# Patient Record
Sex: Female | Born: 1945
Health system: Southern US, Community
[De-identification: ages and names within clinical notes are randomized; demographics above are authoritative.]

## PROBLEM LIST (undated history)

## (undated) DIAGNOSIS — I10 Essential (primary) hypertension: Secondary | ICD-10-CM

## (undated) DIAGNOSIS — K219 Gastro-esophageal reflux disease without esophagitis: Secondary | ICD-10-CM

## (undated) DIAGNOSIS — Z8719 Personal history of other diseases of the digestive system: Secondary | ICD-10-CM

## (undated) DIAGNOSIS — I639 Cerebral infarction, unspecified: Secondary | ICD-10-CM

## (undated) DIAGNOSIS — M1990 Unspecified osteoarthritis, unspecified site: Secondary | ICD-10-CM

## (undated) HISTORY — PX: BILATERAL CARPAL TUNNEL RELEASE: SHX6508

## (undated) HISTORY — PX: TONSILLECTOMY: SUR1361

## (undated) HISTORY — PX: CHOLECYSTECTOMY: SHX55

## (undated) HISTORY — PX: JOINT REPLACEMENT: SHX530

## (undated) HISTORY — PX: OTHER SURGICAL HISTORY: SHX169

---

## 1997-10-24 ENCOUNTER — Other Ambulatory Visit: Admission: RE | Admit: 1997-10-24 | Discharge: 1997-10-24 | Payer: Self-pay | Admitting: Gynecology

## 1997-10-25 ENCOUNTER — Ambulatory Visit (HOSPITAL_BASED_OUTPATIENT_CLINIC_OR_DEPARTMENT_OTHER): Admission: RE | Admit: 1997-10-25 | Discharge: 1997-10-25 | Payer: Self-pay | Admitting: Orthopedic Surgery

## 1997-12-11 ENCOUNTER — Ambulatory Visit (HOSPITAL_BASED_OUTPATIENT_CLINIC_OR_DEPARTMENT_OTHER): Admission: RE | Admit: 1997-12-11 | Discharge: 1997-12-11 | Payer: Self-pay | Admitting: Orthopedic Surgery

## 1998-05-14 ENCOUNTER — Other Ambulatory Visit: Admission: RE | Admit: 1998-05-14 | Discharge: 1998-05-14 | Payer: Self-pay | Admitting: Gynecology

## 1998-11-21 ENCOUNTER — Other Ambulatory Visit: Admission: RE | Admit: 1998-11-21 | Discharge: 1998-11-21 | Payer: Self-pay | Admitting: Gynecology

## 2000-01-20 ENCOUNTER — Other Ambulatory Visit: Admission: RE | Admit: 2000-01-20 | Discharge: 2000-01-20 | Payer: Self-pay | Admitting: Gynecology

## 2000-09-16 ENCOUNTER — Encounter: Payer: Self-pay | Admitting: Orthopedic Surgery

## 2000-09-20 ENCOUNTER — Inpatient Hospital Stay (HOSPITAL_COMMUNITY): Admission: RE | Admit: 2000-09-20 | Discharge: 2000-09-24 | Payer: Self-pay | Admitting: Orthopedic Surgery

## 2000-11-01 ENCOUNTER — Observation Stay (HOSPITAL_COMMUNITY): Admission: RE | Admit: 2000-11-01 | Discharge: 2000-11-02 | Payer: Self-pay | Admitting: Orthopedic Surgery

## 2001-04-26 ENCOUNTER — Other Ambulatory Visit: Admission: RE | Admit: 2001-04-26 | Discharge: 2001-04-26 | Payer: Self-pay | Admitting: Gynecology

## 2002-09-11 ENCOUNTER — Other Ambulatory Visit: Admission: RE | Admit: 2002-09-11 | Discharge: 2002-09-11 | Payer: Self-pay | Admitting: Gynecology

## 2002-09-21 ENCOUNTER — Ambulatory Visit (HOSPITAL_COMMUNITY): Admission: RE | Admit: 2002-09-21 | Discharge: 2002-09-21 | Payer: Self-pay | Admitting: Gynecology

## 2003-02-07 ENCOUNTER — Encounter: Payer: Self-pay | Admitting: Orthopedic Surgery

## 2003-02-19 ENCOUNTER — Inpatient Hospital Stay (HOSPITAL_COMMUNITY): Admission: RE | Admit: 2003-02-19 | Discharge: 2003-02-22 | Payer: Self-pay | Admitting: Orthopedic Surgery

## 2003-04-19 ENCOUNTER — Encounter: Payer: Self-pay | Admitting: Emergency Medicine

## 2003-04-20 ENCOUNTER — Encounter: Payer: Self-pay | Admitting: Internal Medicine

## 2003-04-20 ENCOUNTER — Inpatient Hospital Stay (HOSPITAL_COMMUNITY): Admission: EM | Admit: 2003-04-20 | Discharge: 2003-04-22 | Payer: Self-pay | Admitting: Emergency Medicine

## 2003-11-01 ENCOUNTER — Other Ambulatory Visit: Admission: RE | Admit: 2003-11-01 | Discharge: 2003-11-01 | Payer: Self-pay | Admitting: Gynecology

## 2004-12-11 ENCOUNTER — Other Ambulatory Visit: Admission: RE | Admit: 2004-12-11 | Discharge: 2004-12-11 | Payer: Self-pay | Admitting: Gynecology

## 2005-01-22 ENCOUNTER — Observation Stay (HOSPITAL_COMMUNITY): Admission: RE | Admit: 2005-01-22 | Discharge: 2005-01-23 | Payer: Self-pay | Admitting: Gynecology

## 2006-07-15 ENCOUNTER — Encounter: Admission: RE | Admit: 2006-07-15 | Discharge: 2006-07-15 | Payer: Self-pay | Admitting: Internal Medicine

## 2011-05-08 ENCOUNTER — Encounter (HOSPITAL_COMMUNITY): Payer: PRIVATE HEALTH INSURANCE

## 2011-05-08 ENCOUNTER — Other Ambulatory Visit: Payer: Self-pay | Admitting: Orthopedic Surgery

## 2011-05-08 ENCOUNTER — Ambulatory Visit (HOSPITAL_COMMUNITY)
Admission: RE | Admit: 2011-05-08 | Discharge: 2011-05-08 | Disposition: A | Discharge status: Home / Self Care | Payer: PRIVATE HEALTH INSURANCE | Source: Ambulatory Visit | Attending: Orthopedic Surgery | Admitting: Orthopedic Surgery

## 2011-05-08 ENCOUNTER — Other Ambulatory Visit (HOSPITAL_COMMUNITY): Payer: Self-pay | Admitting: Orthopedic Surgery

## 2011-05-08 DIAGNOSIS — Z01818 Encounter for other preprocedural examination: Secondary | ICD-10-CM

## 2011-05-08 DIAGNOSIS — M169 Osteoarthritis of hip, unspecified: Secondary | ICD-10-CM | POA: Insufficient documentation

## 2011-05-08 DIAGNOSIS — M1610 Unilateral primary osteoarthritis, unspecified hip: Secondary | ICD-10-CM | POA: Insufficient documentation

## 2011-05-08 DIAGNOSIS — Z01812 Encounter for preprocedural laboratory examination: Secondary | ICD-10-CM | POA: Insufficient documentation

## 2011-05-08 DIAGNOSIS — I10 Essential (primary) hypertension: Secondary | ICD-10-CM | POA: Insufficient documentation

## 2011-05-08 LAB — CBC
HCT: 42.1 % (ref 36.0–46.0)
Hemoglobin: 13.5 g/dL (ref 12.0–15.0)
MCH: 27.1 pg (ref 26.0–34.0)
MCHC: 32.1 g/dL (ref 30.0–36.0)
MCV: 84.5 fL (ref 78.0–100.0)
Platelets: 229 10*3/uL (ref 150–400)
RBC: 4.98 MIL/uL (ref 3.87–5.11)
RDW: 13.1 % (ref 11.5–15.5)
WBC: 7.6 10*3/uL (ref 4.0–10.5)

## 2011-05-08 LAB — COMPREHENSIVE METABOLIC PANEL
ALT: 17 U/L (ref 0–35)
AST: 26 U/L (ref 0–37)
Alkaline Phosphatase: 86 U/L (ref 39–117)
CO2: 29 mEq/L (ref 19–32)
Chloride: 102 mEq/L (ref 96–112)
GFR calc Af Amer: >90 mL/min (ref >90)
GFR calc non Af Amer: >90 mL/min (ref >90)
Glucose, Bld: 110 mg/dL — ABNORMAL HIGH (ref 70–99)
Potassium: 3.6 mEq/L (ref 3.5–5.1)
Sodium: 140 mEq/L (ref 135–145)

## 2011-05-08 LAB — URINALYSIS, ROUTINE W REFLEX MICROSCOPIC
Glucose, UA: NEGATIVE mg/dL
Hgb urine dipstick: NEGATIVE
Protein, ur: NEGATIVE mg/dL
Specific Gravity, Urine: 1.005 (ref 1.005–1.030)
pH: 7.5 (ref 5.0–8.0)

## 2011-05-08 LAB — PROTIME-INR: Prothrombin Time: 13 seconds (ref 11.6–15.2)

## 2011-05-13 ENCOUNTER — Inpatient Hospital Stay (HOSPITAL_COMMUNITY)
Admission: RE | Admit: 2011-05-13 | Discharge: 2011-05-15 | DRG: 470 | Disposition: A | Discharge status: Home / Self Care | Payer: PRIVATE HEALTH INSURANCE | Source: Ambulatory Visit | Attending: Orthopedic Surgery | Admitting: Orthopedic Surgery

## 2011-05-13 ENCOUNTER — Inpatient Hospital Stay (HOSPITAL_COMMUNITY): Payer: PRIVATE HEALTH INSURANCE

## 2011-05-13 DIAGNOSIS — Z7982 Long term (current) use of aspirin: Secondary | ICD-10-CM

## 2011-05-13 DIAGNOSIS — Z01818 Encounter for other preprocedural examination: Secondary | ICD-10-CM

## 2011-05-13 DIAGNOSIS — K449 Diaphragmatic hernia without obstruction or gangrene: Secondary | ICD-10-CM | POA: Diagnosis present

## 2011-05-13 DIAGNOSIS — Z7902 Long term (current) use of antithrombotics/antiplatelets: Secondary | ICD-10-CM

## 2011-05-13 DIAGNOSIS — K219 Gastro-esophageal reflux disease without esophagitis: Secondary | ICD-10-CM | POA: Diagnosis present

## 2011-05-13 DIAGNOSIS — M169 Osteoarthritis of hip, unspecified: Principal | ICD-10-CM | POA: Diagnosis present

## 2011-05-13 DIAGNOSIS — M1610 Unilateral primary osteoarthritis, unspecified hip: Principal | ICD-10-CM | POA: Diagnosis present

## 2011-05-13 DIAGNOSIS — Z01812 Encounter for preprocedural laboratory examination: Secondary | ICD-10-CM

## 2011-05-13 DIAGNOSIS — I10 Essential (primary) hypertension: Secondary | ICD-10-CM | POA: Diagnosis present

## 2011-05-13 DIAGNOSIS — Z79899 Other long term (current) drug therapy: Secondary | ICD-10-CM

## 2011-05-13 DIAGNOSIS — Z8673 Personal history of transient ischemic attack (TIA), and cerebral infarction without residual deficits: Secondary | ICD-10-CM

## 2011-05-13 DIAGNOSIS — M069 Rheumatoid arthritis, unspecified: Secondary | ICD-10-CM | POA: Diagnosis present

## 2011-05-13 DIAGNOSIS — N959 Unspecified menopausal and perimenopausal disorder: Secondary | ICD-10-CM | POA: Diagnosis present

## 2011-05-13 LAB — ABO/RH: ABO/RH(D): A NEG

## 2011-05-13 LAB — TYPE AND SCREEN
ABO/RH(D): A NEG
Antibody Screen: NEGATIVE

## 2011-05-14 ENCOUNTER — Other Ambulatory Visit: Payer: Self-pay

## 2011-05-14 LAB — BASIC METABOLIC PANEL
BUN: 8 mg/dL (ref 6–23)
CO2: 25 mEq/L (ref 19–32)
Chloride: 105 mEq/L (ref 96–112)
Creatinine, Ser: 0.52 mg/dL (ref 0.50–1.10)
Potassium: 3.7 mEq/L (ref 3.5–5.1)

## 2011-05-14 LAB — CBC
MCH: 27 pg (ref 26.0–34.0)
MCHC: 32 g/dL (ref 30.0–36.0)
Platelets: 189 10*3/uL (ref 150–400)
RDW: 13.6 % (ref 11.5–15.5)

## 2011-05-15 LAB — BASIC METABOLIC PANEL
BUN: 7 mg/dL (ref 6–23)
Chloride: 105 mEq/L (ref 96–112)
Creatinine, Ser: 0.62 mg/dL (ref 0.50–1.10)
GFR calc Af Amer: >90 mL/min (ref >90)
Glucose, Bld: 106 mg/dL — ABNORMAL HIGH (ref 70–99)

## 2011-05-15 LAB — CBC
HCT: 29.7 % — ABNORMAL LOW (ref 36.0–46.0)
Hemoglobin: 9.7 g/dL — ABNORMAL LOW (ref 12.0–15.0)
MCV: 83.4 fL (ref 78.0–100.0)
RDW: 13.5 % (ref 11.5–15.5)
WBC: 9.4 10*3/uL (ref 4.0–10.5)

## 2011-05-15 NOTE — Op Note (Signed)
[name], [name]               ACCOUNT NO.:  [id number]  MEDICAL RECORD NO.:  [id number]  LOCATION:  0003                         FACILITY:  [facility]  PHYSICIAN:  Ollen Gross, M.D.    DATE OF BIRTH:  May 23, 1946  DATE OF PROCEDURE: DATE OF DISCHARGE:                              OPERATIVE REPORT   PREOPERATIVE DIAGNOSIS:  Osteoarthritis/rheumatoid arthritis, left hip.  POSTOPERATIVE DIAGNOSIS:  Osteoarthritis/rheumatoid arthritis, left hip.  PROCEDURE:  Left total hip arthroplasty.  SURGEON:  Ollen Gross, M.D.  ASSISTANT:  Alexzandrew L. Perkins, P.A.C.  ANESTHESIA:  Spinal.  ESTIMATED BLOOD LOSS:  200.  DRAINS:  Hemovac x1.  COMPLICATIONS:  None.  CONDITION:  Stable to recovery.  BRIEF CLINICAL INDICATION:  Ms. [name] is a 65 year old female with severe nonerosive rheumatoid arthritis with bone-on-bone changes of both hips, left more symptomatic than the right.  She said that pain in her hip for many years, now getting progressively worse, which hurting her at all times, day and night rest and with activity.  She had difficulty with ADLs.  She has a very hard time even walking now.  She used assisted devices.  She has had analgesics for many years for this.  She has had a stage now hip is preventing her from doing anything.  Her x- ray showed severe bone-on-bone erosive changes with subchondral cysts. She presents now for left total hip arthroplasty.  PROCEDURE IN DETAIL:  After successful administration of spinal anesthetic, the patient was placed in the right lateral decubitus position with the left side up and held with the hip positioner.  Left lower extremity was isolated from perineum with plastic drapes and prepped and draped in the usual sterile fashion.  Short posterolateral incision was made with a 10 blade through subcutaneous tissue to the level of the fascia lata, which was incised in line with the skin incision.  Sciatic nerve was palpated and  protected and short rotators and capsule isolated off the femur.  Hip was dislocated and center of the femoral head was marked.  Trial prosthesis was placed such that the center of the trial head corresponds to the center of her native femoral head.  Osteotomy line was marked on the femoral neck and osteotomy made with an oscillating saw.  Femoral heads removed.  Retractors were placed around the proximal femur to gain access to the canal.  The canal finder was placed into the femoral canal and the canal was thoroughly irrigated with saline to remove the fatty contents.  The axial reaming was then performed up to 11.5 mm, proximal reaming to a 16 d and the sleeve machined to a large.  A 16 d large trial sleeve was then placed.  The femurs retracted anteriorly to gain acetabular exposure.  Labrum and osteophytes were removed.  Acetabular reaming begins at 45 coursing increments of 2-53 and then a 54 mm pinnacle acetabular shell was placed in anatomic position and transfixed with dome screws.  An impactor was threaded back into the cup and the cup and pelvis were moving as a solitary unit.  The apex hole eliminator was then placed and a 36 mm neutral +4 marathon liner placed.  We then  placed a trial stem which was 16 x 11 with a 36+ 6 neck.  Her native version was neutral to slightly retroverted.  We added 15 degrees of anteversion with the trial.  A 36+ 0 head was placed.  I reduces easily  so went to 36+ 6 which had more appropriate soft tissue tension.  She had fantastic stability with full extension, full external rotation, 70 degrees flexion, 40 degrees adduction, 90 degrees internal rotation, and 90 degrees of flexion, and 70 degrees of internal rotation.  By placing the left leg on the top of the right, I felt as though the leg lengths were equal.  The hip was then dislocated and all trials were removed.  The permanent 16 d large sleeve was placed with 16 x 11 stem and 36+ 6 neck  about 10-15 degrees beyond native anteversion.  The 36+ 6 ceramic head was placed and hips reduced with the same stability parameters.  The wound was copiously irrigated with saline solution and the short rotators and capsule reattached to the femur through drill holes with Ethibond suture. Fascia lata was closed over Hemovac drain with interrupted #1 Vicryl.  A mixture of 20 cc of Expareal with 50 cc of saline was then into the fascia lata, gluteal muscles, and subcutaneous tissues.  Subcu was then closed with interrupted #1 and 2-0 Vicryl and subcuticular running 4-0 Monocryl.  Incisions cleaned and dried, and Steri-Strips and bulky sterile dressing applied.  Drains hooked to suction.  She was placed into knee immobilizer, awakened, and transported to recovery in stable condition.      Please note that a surgical assistant was a medical necessity for this case in order to perform it in a safe and expeditious manner. Assistant was necessary for retraction of vital neurovascular structures and for proper positioning of the limb to  allow for anatomis placement of the prosthesis.    Ollen Gross, M.D.     FA/MEDQ  D:  05/13/2011  T:  05/13/2011  Job:  [id number]  Electronically Signed by Ollen Gross M.D. on 05/15/2011 09:55:30 AM

## 2011-05-15 NOTE — H&P (Signed)
[name], [name]               ACCOUNT NO.:  [id number]  MEDICAL RECORD NO.:  [id number]  LOCATION:  0003                         FACILITY:  [facility]  PHYSICIAN:  Ollen Gross, M.D.    DATE OF BIRTH:  01-02-1946  DATE OF ADMISSION:  05/13/2011 DATE OF DISCHARGE:                             HISTORY & PHYSICAL   CHIEF COMPLAINT:  Left hip pain.  HISTORY OF PRESENT ILLNESS:  Patient is a 65 year old female, who has been seen by Dr. Lequita Halt for ongoing left hip pain.  She is known to, Dr. Lequita Halt, have previously undergone a right total knee back in March 2002.  The left hip has been bothering her for quite some time now.  She has had increasing difficulty and dysfunction with her hip.  She is originally scheduled to have the hip done back in 2010, but due to social issues and taking care of her mother, she was unable to have that performed at that time.  It continues to have progressively gotten worse with time, with progressive dysfunction.  She has already been found to have bone-on-bone changes in the hip and has impacted her lifestyle and mobility.  It is felt she would benefit undergoing surgical intervention.  Risks and benefits have been discussed and she elected to proceed with surgery.  There are no contraindications such as ongoing infection or progressive neurological disease.  ALLERGIES: 1. PENICILLIN. 2. VANCOMYCIN. 3. Intolerance is MORPHINE makes her "groggy.  She takes blood pressure pill, hydrocodone, baby aspirin, B12, vitamin D, and calcium.  PAST MEDICAL HISTORY: 1. Mini stroke. 2. Hypertension. 3. Hiatal hernia. 4. Reflux disease. 5. Postmenopausal.  CHILDHOOD ILLNESSES:  Measles and mumps.  PAST SURGICAL HISTORY:  Tubal ligation in 1975, carpal tunnel in 1999, oophorectomy in 2000, left total knee replacement in 2001, right total knee replacement in 2002.  FAMILY HISTORY:  Mother with history of stroke.  SOCIAL HISTORY:  Married, retired,  nonsmoker.  No alcohol.  She does have a caregiver lined up.  She has about 3 or 4 steps going into her home.  REVIEW OF SYSTEMS:  GENERAL:  No fevers, chills, or night sweats. NEURO:  No seizures, syncope, or paralysis.  RESPIRATORY:  No shortness of breath, productive cough, or hemoptysis.  CARDIOVASCULAR:  No chest pain, no orthopnea.  GI:  No nausea, vomiting, diarrhea, or constipation.  GU:  No dysuria, hematuria, or discharge. MUSCULOSKELETAL:  Hip pain.  PHYSICAL EXAMINATION:  VITAL SIGNS:  Pulse 88, respirations 14, blood pressure 136/84. GENERAL:  A 65 year old white female, well nourished, well developed, in no acute distress.  She is alert, oriented and cooperative, pleasant. Good historian. HEENT:  Normocephalic, atraumatic.  Pupils are round and reactive.  EOMs intact. NECK:  Supple.  No carotid bruits. CHEST:  Clear anterior posterior chest walls.  No rhonchi, rales, or wheezing. HEART:  Regular rate and rhythm without murmur.  S1, S2 noted.  No rubs, thrills, or palpitations. ABDOMEN:  Soft, nontender.  Bowel sounds are present. RECTAL/BREASTS/GENITALIA:  Not done, not part of present illness. EXTREMITIES:  Left hip flexion 90, 0 internal rotation, 0 external rotation, 0 degrees abduction.  IMPRESSION:  Osteoarthritis, left hip.  PLAN:  The patient will be admitted to [facility] to undergo a left total replacement arthroplasty.  Surgery will be performed by Dr. Ollen Gross.     [name], P.A.C.   ______________________________ Ollen Gross, M.D.    ALP/MEDQ  D:  05/12/2011  T:  05/13/2011  Job:  [id number]  Electronically Signed by Patrica Duel P.A.C. on 05/14/2011 10:30:42 AM Electronically Signed by Ollen Gross M.D. on 05/15/2011 09:54:01 AM

## 2011-05-26 NOTE — Discharge Summary (Signed)
NAMEHANIFA, ANTONETTI               ACCOUNT NO.:  [id number]  MEDICAL RECORD NO.:  [id number]  LOCATION:  1606                         FACILITY:  [facility]  PHYSICIAN:  Ollen Gross, M.D.    DATE OF BIRTH:  Jun 24, 1946  DATE OF ADMISSION:  05/13/2011 DATE OF DISCHARGE:  05/15/2011                              DISCHARGE SUMMARY   ADMITTING DIAGNOSES: 1. Osteoarthritis, left hip. 2. History of mini stroke. 3. Hypertension. 4. Hiatal hernia. 5. Reflux disease. 6. Postmenopausal.  DISCHARGE DIAGNOSES: 1. Osteoarthritis/rheumatoid arthritis, left hip status post left     total knee replacement arthroplasty. 2. Mild postoperative acute blood loss anemia. 3. History of mini stroke. 4. Hypertension. 5. Hiatal hernia. 6. Reflux disease. 7. Postmenopausal.  PROCEDURE:  May 13, 2011, left total hip.  SURGEON:  Ollen Gross, M.D.  ASSISTANT:  Semaj Kham L. Julieana Eshleman, P.A.C.  ANESTHESIA:  Spinal.  CONSULTS:  None.  BRIEF HISTORY:  Ms. Market is a 65 year old female with severe nonerosive rheumatoid arthritis with bone-on-bone changes of both hips, left more symptomatic than right.  She has pain in her hip for many years now, progressively getting worse, hurting all the time.  She has had analgesics for quite some time now.  She is at a stage now where hip is preventing her from doing things.  Her x-rays show severe bone-on- bone with erosive changes, subchondral cyst, felt to be a good candidate and subsequently admitted for hip replacement.  LABORATORY DATA:  Admission CBC not scanned in the chart, but the postoperative hemoglobin was down to 10.4.  Last hemoglobin and hematocrit 9.7 and 29.7.  Chemistry panel on admission not scanned in the chart, but BMETs for 48 hours were followed and remained within normal limits.  Blood group type A negative.  X-RAYS:  Hip and pelvis films demonstrate left total hip with no complicating features.  HOSPITAL COURSE:  The patient  admitted to [facility], taken to OR, underwent above-stated procedure without complication.  The patient tolerated procedure well, later transferred to recovery room from orthopedic floor, started on p.o. and IV analgesic pain control following surgery, given 24 hours postoperative IV antibiotics, had some pain on the evening of surgery until the morning of day #1.  We started back on all of her home meds.  Hemoglobin looked good.  She had good urinary output.  Hemovac drain was out.  We held her lisinopril initially because of the ACE inhibitor and the anesthesia, started back if her blood pressure went up, started getting up out of bed on day #1. Protected weightbearing, partial weightbearing 25-50%.  By day #2, she was doing a little bit better.  Pain was under a little bit better control.  Dressing was changed.  Incision looked good.  possibility that she would go home later that evening, but she did need a little bit more time with therapy.  We may set up arrangements later on that evening. She did progress with her second session where she met all of her goals. She was doing fine and wanted to go.  DISCHARGE PLAN: 1. The patient discharged home on May 15, 2011. 2. Discharge diagnoses:  Please see above. 3. Discharge meds:  Nu-Iron, Robaxin, OxyIR, Artificial Tears,     lisinopril, Prilosec. 4. Diet:  Heart healthy diet. 5. Activity:  Partial weightbearing 25-50%. 6. Followup:  2 weeks.  DISPOSITION AND CONDITION ON DISCHARGE:  Improving.     Sakina Briones L. Julien Girt, P.A.C.   ______________________________ Ollen Gross, M.D.    ALP/MEDQ  D:  05/26/2011  T:  05/26/2011  Job:  [id number]

## 2013-09-21 ENCOUNTER — Other Ambulatory Visit: Payer: Self-pay | Admitting: Orthopedic Surgery

## 2013-09-22 IMAGING — CR DG HIP (WITH OR WITHOUT PELVIS) 2-3V*L*
3 series · 3 of 3 positions shown · non-contrast
Comparison: None.

CLINICAL DATA: Chronic hip pain.  Preop.

LEFT HIP - COMPLETE 2+ VIEW

[t pelvis a.p.]
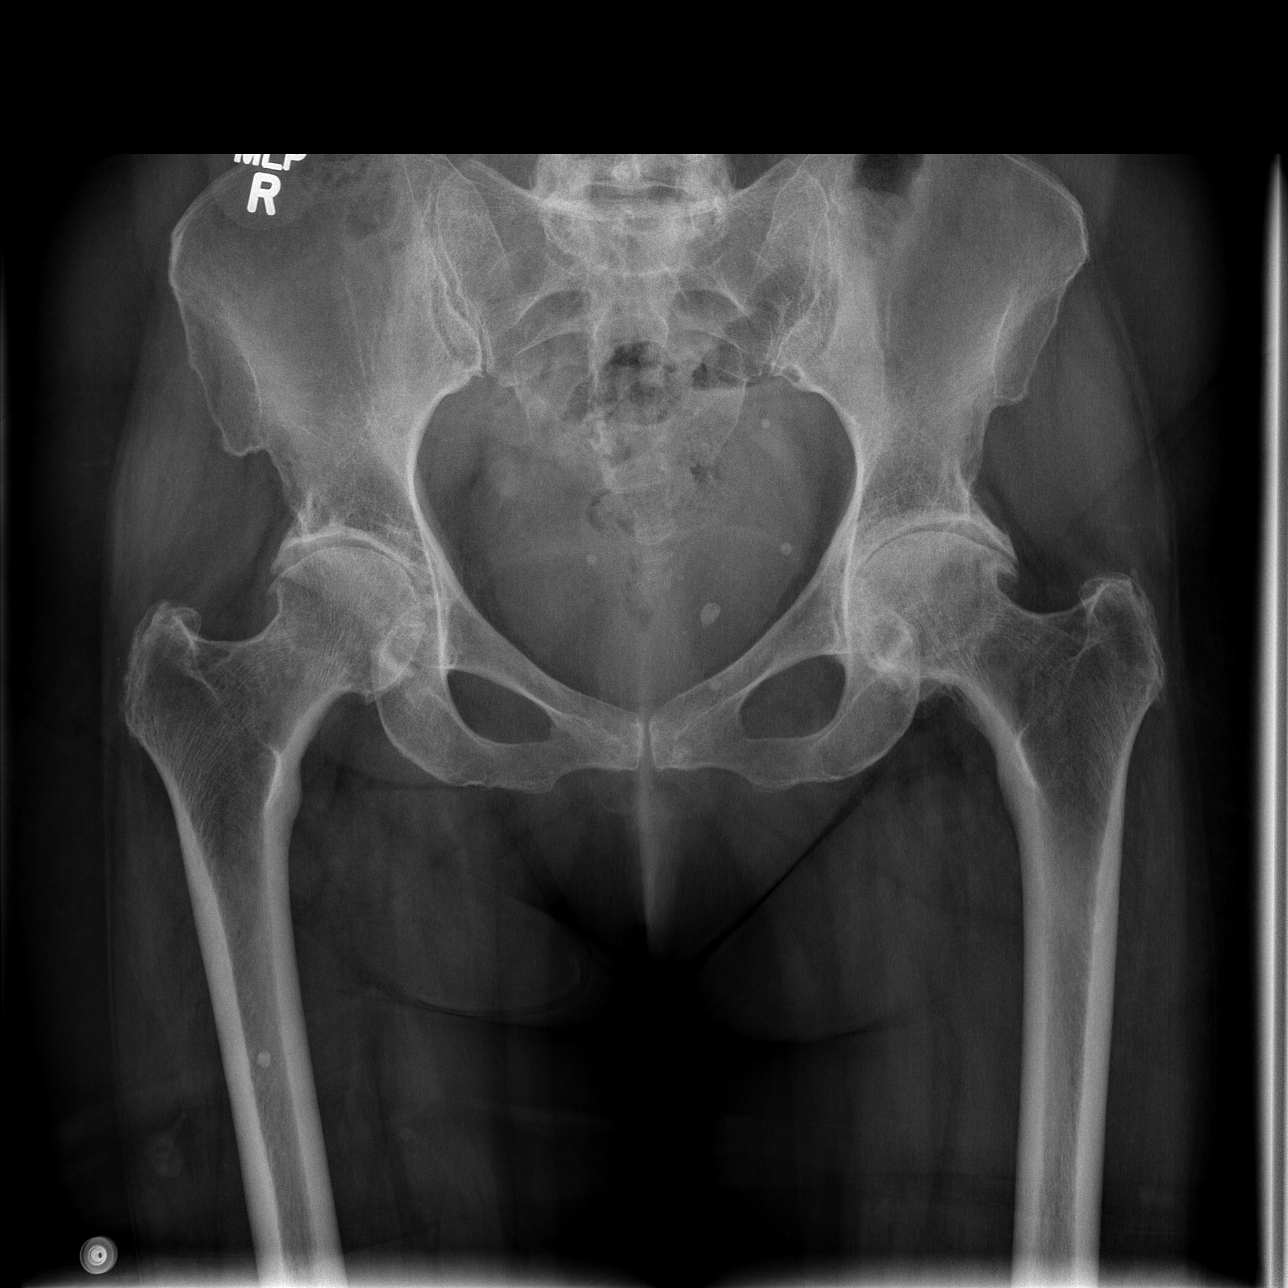

[t hip ap left]
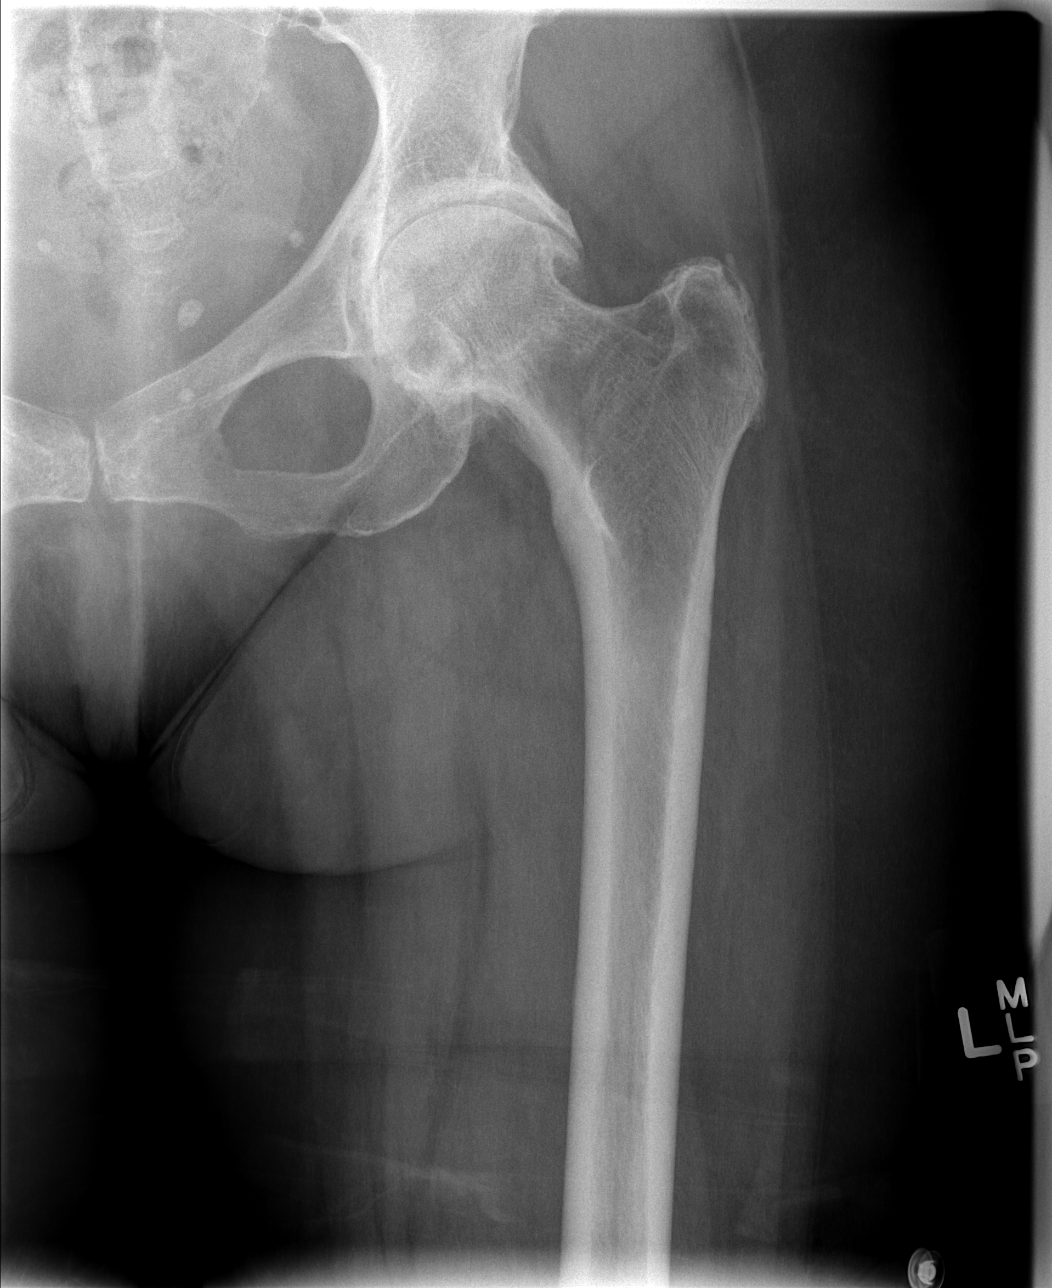

[t hip frog leg left]
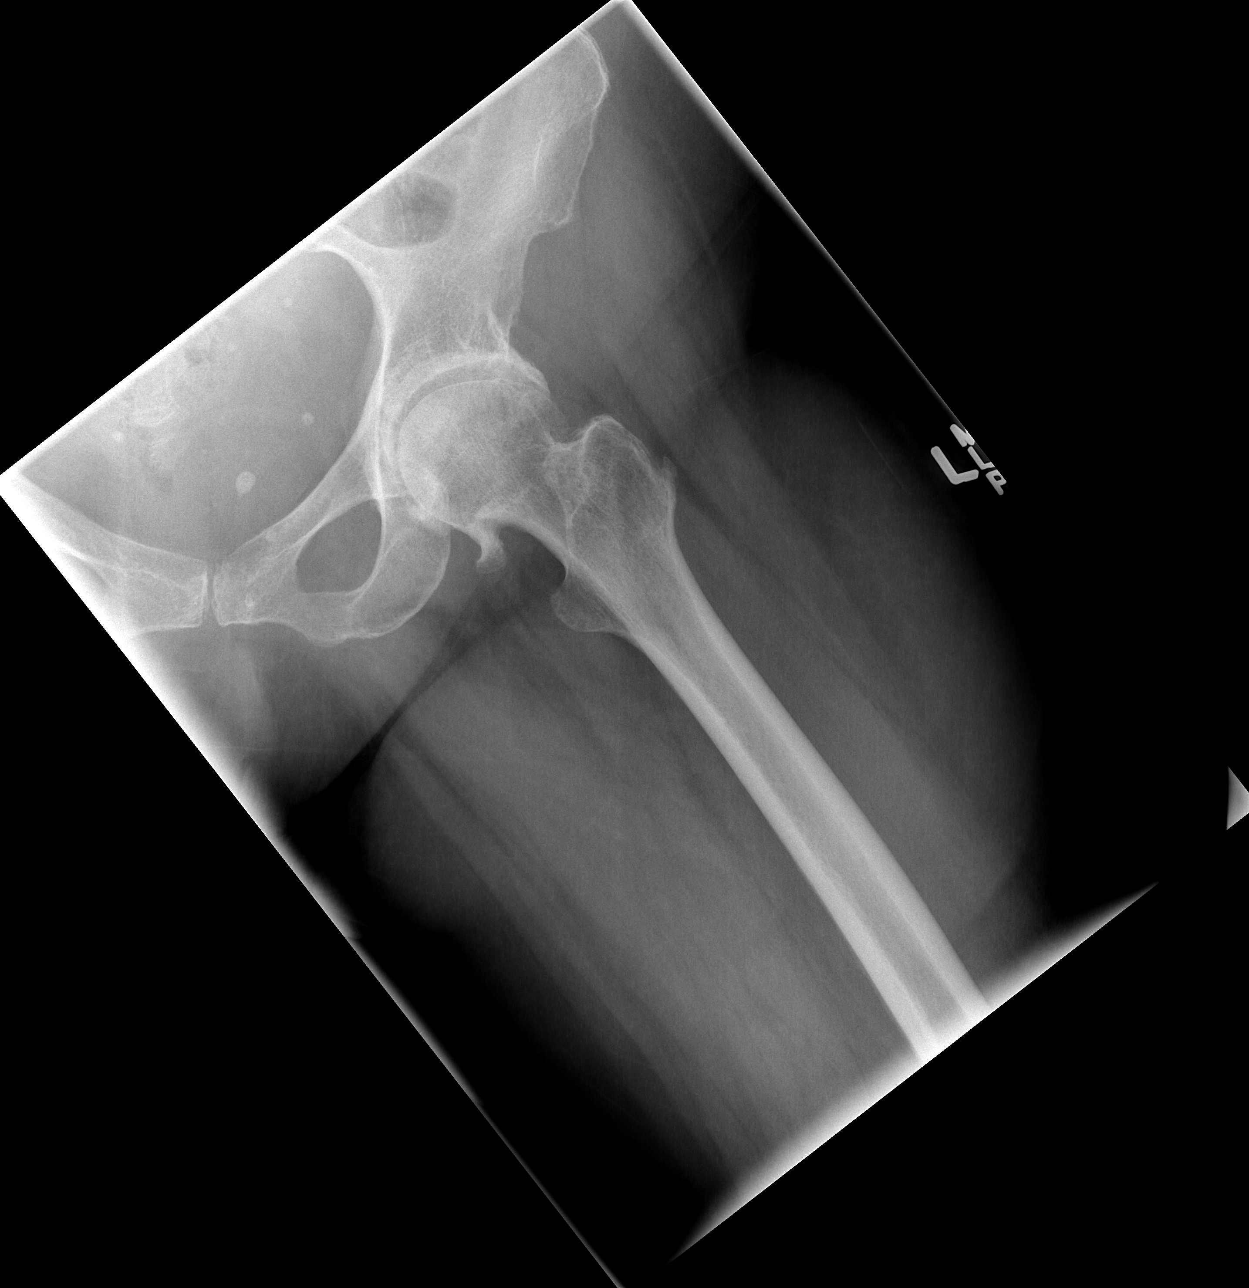

[3 of 3 positions shown; findings below may reference images not displayed]

FINDINGS: Prominent bilateral hip joint degenerative changes more
notable on the left with significant osteophyte formation.  No
underlying fracture or bony destructive lesion.
IMPRESSION: Prominent bilateral hip joint degenerative changes more notable on
the left with significant osteophyte formation.

## 2013-10-18 ENCOUNTER — Encounter (HOSPITAL_COMMUNITY): Admission: RE | Payer: Self-pay | Source: Ambulatory Visit

## 2013-10-18 ENCOUNTER — Inpatient Hospital Stay (HOSPITAL_COMMUNITY): Admission: RE | Admit: 2013-10-18 | Payer: Medicare Other | Source: Ambulatory Visit | Admitting: Orthopedic Surgery

## 2013-10-18 SURGERY — ARTHROPLASTY, HIP, TOTAL, ANTERIOR APPROACH
Anesthesia: Choice | Site: Hip | Laterality: Right

## 2014-11-20 ENCOUNTER — Ambulatory Visit: Payer: Self-pay | Admitting: Orthopedic Surgery

## 2014-11-20 NOTE — Progress Notes (Signed)
Preoperative surgical orders have been place into the Epic hospital system for [name] on 11/20/2014, 11:39 AM  by Mickel Crow for surgery on 12-19-2014.  Preop Total Hip - Anterior Approach orders including IV Tylenol, and IV Decadron as long as there are no contraindications to the above medications. Arlee Muslim, PA-C

## 2014-12-11 NOTE — H&P (Signed)
TOTAL HIP ADMISSION H&P  Patient is admitted for right total hip arthroplasty.  Subjective:  Chief Complaint: right hip pain  HPI: [name], 69 y.o. female, has a history of pain and functional disability in the right hip(s) due to arthritis and patient has failed non-surgical conservative treatments for greater than 12 weeks to include NSAID's and/or analgesics, corticosteriod injections, flexibility and strengthening excercises, use of assistive devices and activity modification.  Onset of symptoms was gradual starting 3 years ago with gradually worsening course since that time.The patient noted no past surgery on the right hip(s).  Patient currently rates pain in the right hip at 8 out of 10 with activity. Patient has night pain, worsening of pain with activity and weight bearing, pain that interfers with activities of daily living, pain with passive range of motion and crepitus. Patient has evidence of periarticular osteophytes and joint space narrowing by imaging studies. This condition presents safety issues increasing the risk of falls. There is no current active infection.   Past Surgical History  Total Knee Replacement Left - 2001, Right - 2003 Oophorectomy; Bilateral Date: 2002. Carpal Tunnel Repair Date: 1999. bilateral Tubal Ligation Date: 52. Hysterectomy partial (non-cancerous)  Past Medical History Postmenopause Childhood Illness: Mumps Rheumatoid Arthritis Asthma Stroke (Mini) Hypertension Gastroesophageal Reflux Disease Hiatal Hernia Childhood Illness: Measels  Medication History Simponi Aria (50MG /4ML Solution, Intravenous once every two months) Active. Omeprazole (20MG  Capsule DR, Oral) Active. Calcium Carbonate (Oral) Specific dose unknown - Active. Vitamin D (Oral) Specific dose unknown - Active. Carvedilol (6.25MG  Tablet, Oral) Active. Lisinopril (Oral) Specific dose unknown - Active. Aspirin Buffered (325MG  Tablet, Oral)  Active. Leflunomide (10MG  Tablet, Oral) Active.  Allergies  Penicillins Vancomycin  History  Substance Use Topics  . Smoking status: No  . Smokeless tobacco: No  . Alcohol Use: No    Review of Systems  Constitutional: Positive for malaise/fatigue. Negative for fever, chills, weight loss and diaphoresis.  HENT: Negative.   Eyes: Negative.   Respiratory: Positive for wheezing. Negative for cough, hemoptysis, sputum production and shortness of breath.   Cardiovascular: Negative.   Gastrointestinal: Negative.   Genitourinary: Positive for frequency. Negative for dysuria, urgency, hematuria and flank pain.  Musculoskeletal: Positive for back pain and joint pain. Negative for myalgias, falls and neck pain.       Right hip pain  Skin: Negative.   Neurological: Negative.  Negative for weakness.  Endo/Heme/Allergies: Negative.   Psychiatric/Behavioral: Negative.     Objective:  Physical Exam  Constitutional: She is oriented to person, place, and time. She appears well-developed and well-nourished. No distress.  HENT:  Head: Normocephalic and atraumatic.  Right Ear: External ear normal.  Left Ear: External ear normal.  Nose: Nose normal.  Mouth/Throat: Oropharynx is clear and moist.  Eyes: Conjunctivae and EOM are normal.  Neck: Normal range of motion. Neck supple.  Cardiovascular: Normal rate, regular rhythm, normal heart sounds and intact distal pulses.   No murmur heard. Respiratory: Effort normal and breath sounds normal. No respiratory distress. She has no wheezes.  GI: Soft. Bowel sounds are normal. She exhibits no distension. There is no tenderness.  Musculoskeletal:       Right hip: She exhibits decreased range of motion, decreased strength and crepitus.       Left hip: Normal.       Right knee: Normal.       Left knee: Normal.  Her right hip can be flexed to 90, no internal rotation, about 10 degrees of external  rotation, 10 degrees of abduction.  Neurological:  She is alert and oriented to person, place, and time. She has normal strength and normal reflexes. No sensory deficit.  Skin: No rash noted. She is not diaphoretic. No erythema.  Psychiatric: She has a normal mood and affect. Her behavior is normal.    Vitals  Weight: 162 lb Height: 64in Body Surface Area: 1.79 m Body Mass Index: 27.81 kg/m  Pulse: 76 (Regular)  BP: 132/80 (Sitting, Left Arm, Standard)  Imaging Review Plain radiographs demonstrate severe degenerative joint disease of the right hip(s). The bone quality appears to be good for age and reported activity level.  Assessment/Plan:  End stage primary osteoarthritis, right hip(s)  The patient history, physical examination, clinical judgement of the provider and imaging studies are consistent with end stage degenerative joint disease of the right hip(s) and total hip arthroplasty is deemed medically necessary. The treatment options including medical management, injection therapy, arthroscopy and arthroplasty were discussed at length. The risks and benefits of total hip arthroplasty were presented and reviewed. The risks due to aseptic loosening, infection, stiffness, dislocation/subluxation,  thromboembolic complications and other imponderables were discussed.  The patient acknowledged the explanation, agreed to proceed with the plan and consent was signed. Patient is being admitted for inpatient treatment for surgery, pain control, PT, OT, prophylactic antibiotics, VTE prophylaxis, progressive ambulation and ADL's and discharge planning.The patient is planning to be discharged home with home health services    Topical TXA Wants spinal PCP: Dr. Pleas Koch, PA-C

## 2015-03-19 ENCOUNTER — Ambulatory Visit: Payer: Self-pay | Admitting: Orthopedic Surgery

## 2015-03-19 NOTE — Progress Notes (Signed)
Preoperative surgical orders have been place into the Epic hospital system for [name] on 03/19/2015, 4:58 PM  by Mickel Crow for surgery on 04-17-2015.  Preop Total Hip - Anterior Approach orders including IV Tylenol, and IV Decadron as long as there are no contraindications to the above medications. PCN Allergy - Throat Swelling, Vancomycin Allergy - Itching.  IV Clindamycin ordered for surgery at this time. Arlee Muslim, PA-C

## 2015-03-19 NOTE — H&P (Signed)
[name] DOB: September 09, 1945 Married / Language: English / Race: White Female Date of Admission:  04/17/2015 CC:  Right Hip Pain History of Present Illness The patient is a 69 year old female who comes in for a preoperative History and Physical. The patient is scheduled for a right total hip arthroplasty (anterior) to be performed by Dr. Dione Plover. Aluisio, MD at [facility] on 04-17-2015. The patient is a 69 year old female who presents today for follow up of their hip. The patient is being followed for their right hip pain and osteoarthritis. They are 1 year(s) (and 2 months) out from the last office visit (and 1 year, 3 months out from the last IA injection with Dr. Nelva Bush). Symptoms reported today include: pain (worse since last Friday). The patient feels that they are doing poorly and report their pain level to be moderate. Current treatment includes: activity modification. The following medication has been used for pain control: Hydrocodone (signed a contract with her Neurologist). She said that the pain in her right hip has gotten much worse compared to a year ago. She was going to have the hip done early this year but had to cancel due to a medical issues. She is at a stage now where she is ready to go ahead and get this fixed. She is doing much better and now wishes to proceed with surgery. They have been treated conservatively in the past for the above stated problem and despite conservative measures, they continue to have progressive pain and severe functional limitations and dysfunction. They have failed non-operative management including home exercise, medications, and injections. It is felt that they would benefit from undergoing total joint replacement. Risks and benefits of the procedure have been discussed with the patient and they elect to proceed with surgery. There are no active contraindications to surgery such as ongoing infection or rapidly progressive neurological  disease.  Problem List/Past Medical Primary osteoarthritis of right hip (M16.11) Osteoarthritis, Hip (715.35) S/P Left total hip arthroplasty (V43.64) Stroke (Mini) Childhood Illness: Mumps Gastroesophageal Reflux Disease Hypertension Hiatal Hernia Childhood Illness: Measels Rheumatoid Arthritis Postmenopause Asthma Hemorrhoids Irritable bowel syndrome Diverticulosis  Allergies Penicillins Throat Swelling Vancomycin HCl *ANTI-INFECTIVE AGENTS - MISC.* Itching  Intolerance Morphine Sulfate *ANALGESICS - OPIOID* Nausea.  Family History Hypertension mother Osteoarthritis mother Cerebrovascular Accident mother and father Heart disease in female family member before age 8 Congestive Heart Failure mother and father Heart Disease First Degree Relatives. mother and father Osteoporosis mother  Social History Children 2 Living situation live with spouse Drug/Alcohol Rehab (Previously) no Tobacco use Never smoker. never smoker Tobacco / smoke exposure no Exercise Exercises rarely; does other Number of flights of stairs before winded greater than 5 Illicit drug use no Drug/Alcohol Rehab (Currently) no Current work status retired Alcohol use never consumed alcohol Pain Contract no Marital status married  Medication History Leflunomide (10MG  Tablet, Oral) Active. Lisinopril (Oral) Specific dose unknown - Active. Omeprazole (20MG  Capsule DR, Oral) Active. Carvedilol (6.25MG  Tablet, Oral) Active. Aspirin Buffered (325MG  Tablet, Oral) Active. Calcium Carbonate (Oral) Specific dose unknown - Active. Vitamin D (Oral) Specific dose unknown - Active.  Past Surgical History Oophorectomy; Bilateral Date: 2002. Total Knee Replacement Left - 2001, Right - 2003 Carpal Tunnel Repair Date: 1999. bilateral Tubal Ligation Date: 30. Hysterectomy partial (non-cancerous) Tonsillectomy Date: 68. Total Hip Replacement - Left Date:  04/2011.   Review of Systems General Present- Chills and Fatigue. Not Present- Fever, Memory Loss, Night Sweats, Weight Gain and Weight Loss.  Skin Not Present- Eczema, Hives, Itching, Lesions and Rash. HEENT Not Present- Dentures, Double Vision, Headache, Hearing Loss, Tinnitus and Visual Loss. Respiratory Present- Wheezing. Not Present- Allergies, Chronic Cough, Coughing up blood, Shortness of breath at rest and Shortness of breath with exertion. Cardiovascular Present- Swelling. Not Present- Chest Pain, Difficulty Breathing Lying Down, Murmur, Palpitations and Racing/skipping heartbeats. Gastrointestinal Present- Abdominal Pain and Nausea. Not Present- Bloody Stool, Constipation, Diarrhea, Difficulty Swallowing, Heartburn, Jaundice, Loss of appetitie and Vomiting. Female Genitourinary Present- Urinating at Night and Weak urinary stream. Not Present- Blood in Urine, Discharge, Flank Pain, Incontinence, Painful Urination, Urgency, Urinary frequency and Urinary Retention. Musculoskeletal Present- Back Pain, Joint Pain, Joint Swelling, Morning Stiffness, Muscle Pain and Muscle Weakness. Not Present- Spasms. Neurological Not Present- Blackout spells, Difficulty with balance, Dizziness, Paralysis, Tremor and Weakness. Psychiatric Not Present- Insomnia.  Vitals Weight: 160 lb Height: 64in Weight was reported by patient. Height was reported by patient. Body Surface Area: 1.78 m Body Mass Index: 27.46 kg/m  BP: 118/78 (Sitting, Right Arm, Standard)  Physical Exam General Mental Status -Alert, cooperative and good historian. General Appearance-pleasant, Not in acute distress. Orientation-Oriented X3. Build & Nutrition-Well nourished and Well developed.  Head and Neck Head-normocephalic, atraumatic . Neck Global Assessment - supple, no bruit auscultated on the right, no bruit auscultated on the left.  Eye Pupil - Bilateral-Regular and Round. Motion -  Bilateral-EOMI.  Chest and Lung Exam Auscultation Breath sounds - clear at anterior chest wall and clear at posterior chest wall. Adventitious sounds - No Adventitious sounds.  Cardiovascular Auscultation Rhythm - Regular rate and rhythm. Heart Sounds - S1 WNL and S2 WNL. Murmurs & Other Heart Sounds - Auscultation of the heart reveals - No Murmurs.  Abdomen Palpation/Percussion Tenderness - Abdomen is non-tender to palpation. Rigidity (guarding) - Abdomen is soft. Auscultation Auscultation of the abdomen reveals - Bowel sounds normal.  Female Genitourinary Note: Not done, not pertinent to present illness   Musculoskeletal Note: On exam she is in no distress. Her right hip can be flexed to 90, no internal rotation, about 10 degrees of external rotation, 10 degrees of abduction. She walks with a significant limp. She is not having any pain at all on range of motion of her left hip, which we replaced several years ago.  IMAGING: Radiographs, AP pelvis and lateral of the right hip, show bone-on-bone arthritis in the right hip with subchondral cystic formation. She has her prosthesis on the left in good position with no abnormalities  Assessment & Plan Primary osteoarthritis of right hip (M16.11) Note:Surgical Plans: Right Total Hip Replacement - Anterior Approach  Disposition: Home  PCP: Dr. Gilford Rile  Topical TXA - History of Stroke  Anesthesia Issues: None  Signed electronically by Joelene Millin, III PA-C

## 2015-03-21 ENCOUNTER — Ambulatory Visit: Payer: Self-pay | Admitting: Orthopedic Surgery

## 2015-03-21 MED ORDER — CEFAZOLIN SODIUM-DEXTROSE 2-3 GM-% IV SOLR: 2.0000 g | INTRAVENOUS | Status: DC

## 2015-04-08 NOTE — Patient Instructions (Addendum)
[name]  04/08/2015   Your procedure is scheduled on:    04/17/2015    Report to [facility] Main  Entrance take [place]  elevators to 3rd floor to  [place] at   1130 AM.  Call this number if you have problems the morning of surgery [phone]   Remember: ONLY 1 PERSON MAY GO WITH YOU TO SHORT STAY TO GET  READY MORNING OF [place].  Do not eat food after midnite. May have clear liquids until 0630am morning of surgery then nothing by mouth.       Take these medicines the morning of surgery with A SIP OF WATER:   Carvedilol ( coreg ) , prilosec  DO NOT TAKE ANY DIABETIC MEDICATIONS DAY OF YOUR SURGERY                               You may not have any metal on your body including hair pins and              piercings  Do not wear jewelry, make-up, lotions, powders or perfumes, deodorant             Do not wear nail polish.  Do not shave  48 hours prior to surgery.     Do not bring valuables to the hospital. [place].  Contacts, dentures or bridgework may not be worn into surgery.  Leave suitcase in the car. After surgery it may be brought to your room.       Special Instructions: coughing and deep breathing exercises, leg exercises               Please read over the following fact sheets you were given: _____________________________________________________________________             [facility] - Preparing for Surgery Before surgery, you can play an important role.  Because skin is not sterile, your skin needs to be as free of germs as possible.  You can reduce the number of germs on your skin by washing with CHG (chlorahexidine gluconate) soap before surgery.  CHG is an antiseptic cleaner which kills germs and bonds with the skin to continue killing germs even after washing. Please DO NOT use if you have an allergy to CHG or antibacterial soaps.  If your skin becomes reddened/irritated  stop using the CHG and inform your nurse when you arrive at Short Stay. Do not shave (including legs and underarms) for at least 48 hours prior to the first CHG shower.  You may shave your face/neck. Please follow these instructions carefully:  1.  Shower with CHG Soap the night before surgery and the  morning of Surgery.  2.  If you choose to wash your hair, wash your hair first as usual with your  normal  shampoo.  3.  After you shampoo, rinse your hair and body thoroughly to remove the  shampoo.                           4.  Use CHG as you would any other liquid soap.  You can apply chg directly  to the skin and wash  Gently with a scrungie or clean washcloth.  5.  Apply the CHG Soap to your body ONLY FROM THE NECK DOWN.   Do not use on face/ open                           Wound or open sores. Avoid contact with eyes, ears mouth and genitals (private parts).                       Wash face,  Genitals (private parts) with your normal soap.             6.  Wash thoroughly, paying special attention to the area where your surgery  will be performed.  7.  Thoroughly rinse your body with warm water from the neck down.  8.  DO NOT shower/wash with your normal soap after using and rinsing off  the CHG Soap.                9.  Pat yourself dry with a clean towel.            10.  Wear clean pajamas.            11.  Place clean sheets on your bed the night of your first shower and do not  sleep with pets. Day of Surgery : Do not apply any lotions/deodorants the morning of surgery.  Please wear clean clothes to the hospital/surgery center.  FAILURE TO FOLLOW THESE INSTRUCTIONS MAY RESULT IN THE CANCELLATION OF YOUR SURGERY PATIENT SIGNATURE_________________________________  NURSE SIGNATURE__________________________________  ________________________________________________________________________    CLEAR LIQUID DIET   Foods Allowed                                                                      Foods Excluded  Coffee and tea, regular and decaf                             liquids that you cannot  Plain Jell-O in any flavor                                             see through such as: Fruit ices (not with fruit pulp)                                     milk, soups, orange juice  Iced Popsicles                                    All solid food Carbonated beverages, regular and diet                                    Cranberry, grape and apple juices Sports drinks like Gatorade Lightly seasoned clear broth or consume(fat free) Sugar, honey syrup  Sample Menu Breakfast                                Lunch                                     Supper Cranberry juice                    Beef broth                            Chicken broth Jell-O                                     Grape juice                           Apple juice Coffee or tea                        Jell-O                                      Popsicle                                                Coffee or tea                        Coffee or tea  _____________________________________________________________________   WHAT IS A BLOOD TRANSFUSION? Blood Transfusion Information  A transfusion is the replacement of blood or some of its parts. Blood is made up of multiple cells which provide different functions.  Red blood cells carry oxygen and are used for blood loss replacement.  White blood cells fight against infection.  Platelets control bleeding.  Plasma helps clot blood.  Other blood products are available for specialized needs, such as hemophilia or other clotting disorders. BEFORE THE TRANSFUSION  Who gives blood for transfusions?   Healthy volunteers who are fully evaluated to make sure their blood is safe. This is blood bank blood. Transfusion therapy is the safest it has ever been in the practice of medicine. Before blood is taken from a donor, a complete history is taken to  make sure that person has no history of diseases nor engages in risky social behavior (examples are intravenous drug use or sexual activity with multiple partners). The donor's travel history is screened to minimize risk of transmitting infections, such as malaria. The donated blood is tested for signs of infectious diseases, such as HIV and hepatitis. The blood is then tested to be sure it is compatible with you in order to minimize the chance of a transfusion reaction. If you or a relative donates blood, this is often done in anticipation of surgery and is not appropriate for emergency situations. It takes many days to process the donated blood. RISKS AND COMPLICATIONS Although transfusion therapy is very safe and saves many lives, the main dangers of transfusion include:  1. Getting an infectious disease. 2. Developing a transfusion reaction. This is an allergic reaction to something in the blood you were given. Every precaution is taken to prevent this. The decision to have a blood transfusion has been considered carefully by your caregiver before blood is given. Blood is not given unless the benefits outweigh the risks. AFTER THE TRANSFUSION  Right after receiving a blood transfusion, you will usually feel much better and more energetic. This is especially true if your red blood cells have gotten low (anemic). The transfusion raises the level of the red blood cells which carry oxygen, and this usually causes an energy increase.  The nurse administering the transfusion will monitor you carefully for complications. HOME CARE INSTRUCTIONS  No special instructions are needed after a transfusion. You may find your energy is better. Speak with your caregiver about any limitations on activity for underlying diseases you may have. SEEK MEDICAL CARE IF:   Your condition is not improving after your transfusion.  You develop redness or irritation at the intravenous (IV) site. SEEK IMMEDIATE MEDICAL CARE  IF:  Any of the following symptoms occur over the next 12 hours:  Shaking chills.  You have a temperature by mouth above 102 F (38.9 C), not controlled by medicine.  Chest, back, or muscle pain.  People around you feel you are not acting correctly or are confused.  Shortness of breath or difficulty breathing.  Dizziness and fainting.  You get a rash or develop hives.  You have a decrease in urine output.  Your urine turns a dark color or changes to pink, red, or brown. Any of the following symptoms occur over the next 10 days:  You have a temperature by mouth above 102 F (38.9 C), not controlled by medicine.  Shortness of breath.  Weakness after normal activity.  The white part of the eye turns yellow (jaundice).  You have a decrease in the amount of urine or are urinating less often.  Your urine turns a dark color or changes to pink, red, or brown. Document Released: 06/26/2000 Document Revised: 09/21/2011 Document Reviewed: 02/13/2008 ExitCare Patient Information 2014 [place].  _______________________________________________________________________  Incentive Spirometer  An incentive spirometer is a tool that can help keep your lungs clear and active. This tool measures how well you are filling your lungs with each breath. Taking long deep breaths may help reverse or decrease the chance of developing breathing (pulmonary) problems (especially infection) following:  A long period of time when you are unable to move or be active. BEFORE THE PROCEDURE   If the spirometer includes an indicator to show your best effort, your nurse or respiratory therapist will set it to a desired goal.  If possible, sit up straight or lean slightly forward. Try not to slouch.  Hold the incentive spirometer in an upright position. INSTRUCTIONS FOR USE  3. Sit on the edge of your bed if possible, or sit up as far as you can in bed or on a chair. 4. Hold the incentive  spirometer in an upright position. 5. Breathe out normally. 6. Place the mouthpiece in your mouth and seal your lips tightly around it. 7. Breathe in slowly and as deeply as possible, raising the piston or the ball toward the top of the column. 8. Hold your breath for 3-5 seconds or for as long as possible. Allow the piston or ball to fall to the bottom of the column. 9. Remove the mouthpiece from your mouth and breathe out  normally. 10. Rest for a few seconds and repeat Steps 1 through 7 at least 10 times every 1-2 hours when you are awake. Take your time and take a few normal breaths between deep breaths. 11. The spirometer may include an indicator to show your best effort. Use the indicator as a goal to work toward during each repetition. 12. After each set of 10 deep breaths, practice coughing to be sure your lungs are clear. If you have an incision (the cut made at the time of surgery), support your incision when coughing by placing a pillow or rolled up towels firmly against it. Once you are able to get out of bed, walk around indoors and cough well. You may stop using the incentive spirometer when instructed by your caregiver.  RISKS AND COMPLICATIONS  Take your time so you do not get dizzy or light-headed.  If you are in pain, you may need to take or ask for pain medication before doing incentive spirometry. It is harder to take a deep breath if you are having pain. AFTER USE  Rest and breathe slowly and easily.  It can be helpful to keep track of a log of your progress. Your caregiver can provide you with a simple table to help with this. If you are using the spirometer at home, follow these instructions: [place] IF:   You are having difficultly using the spirometer.  You have trouble using the spirometer as often as instructed.  Your pain medication is not giving enough relief while using the spirometer.  You develop fever of 100.5 F (38.1 C) or higher. SEEK  IMMEDIATE MEDICAL CARE IF:   You cough up bloody sputum that had not been present before.  You develop fever of 102 F (38.9 C) or greater.  You develop worsening pain at or near the incision site. MAKE SURE YOU:   Understand these instructions.  Will watch your condition.  Will get help right away if you are not doing well or get worse. Document Released: 11/09/2006 Document Revised: 09/21/2011 Document Reviewed: 01/10/2007 [facility] Patient Information 2014 [place], [place].   ________________________________________________________________________

## 2015-04-10 ENCOUNTER — Encounter (HOSPITAL_COMMUNITY): Payer: Self-pay

## 2015-04-10 ENCOUNTER — Encounter (HOSPITAL_COMMUNITY)
Admission: RE | Admit: 2015-04-10 | Discharge: 2015-04-10 | Disposition: A | Discharge status: Home / Self Care | Payer: Commercial Managed Care - PPO | Source: Ambulatory Visit | Attending: Orthopedic Surgery | Admitting: Orthopedic Surgery

## 2015-04-10 DIAGNOSIS — Z01818 Encounter for other preprocedural examination: Secondary | ICD-10-CM | POA: Diagnosis present

## 2015-04-10 DIAGNOSIS — M1611 Unilateral primary osteoarthritis, right hip: Secondary | ICD-10-CM | POA: Diagnosis not present

## 2015-04-10 HISTORY — DX: Gastro-esophageal reflux disease without esophagitis: K21.9

## 2015-04-10 HISTORY — DX: Essential (primary) hypertension: I10

## 2015-04-10 HISTORY — DX: Unspecified osteoarthritis, unspecified site: M19.90

## 2015-04-10 HISTORY — DX: Cerebral infarction, unspecified: I63.9

## 2015-04-10 HISTORY — DX: Personal history of other diseases of the digestive system: Z87.19

## 2015-04-10 LAB — URINALYSIS, ROUTINE W REFLEX MICROSCOPIC
Bilirubin Urine: NEGATIVE
GLUCOSE, UA: NEGATIVE mg/dL
Hgb urine dipstick: NEGATIVE
Ketones, ur: NEGATIVE mg/dL
LEUKOCYTES UA: NEGATIVE
NITRITE: NEGATIVE
PH: 7.5 (ref 5.0–8.0)
Protein, ur: NEGATIVE mg/dL
SPECIFIC GRAVITY, URINE: 1.008 (ref 1.005–1.030)
Urobilinogen, UA: 0.2 mg/dL (ref 0.0–1.0)

## 2015-04-10 LAB — SURGICAL PCR SCREEN
MRSA, PCR: NEGATIVE
STAPHYLOCOCCUS AUREUS: NEGATIVE

## 2015-04-10 LAB — PROTIME-INR
INR: 1 (ref 0.00–1.49)
PROTHROMBIN TIME: 13.4 s (ref 11.6–15.2)

## 2015-04-10 LAB — APTT: APTT: 29 s (ref 24–37)

## 2015-04-10 NOTE — Progress Notes (Signed)
DR Bea Graff - 04/02/2015- clearance on chart  EKG- 04/02/2015 on chart Labs- CBC/DIFf, CMP- 04/02/15 on chart  CT abdomen and pelvis- 02/18/2015 on chart discusses lungs.  CXR- 10/12/2014 on chart EKG- 11/12/2014 on chart

## 2015-04-17 ENCOUNTER — Inpatient Hospital Stay (HOSPITAL_COMMUNITY): Payer: Commercial Managed Care - PPO | Admitting: Anesthesiology

## 2015-04-17 ENCOUNTER — Inpatient Hospital Stay (HOSPITAL_COMMUNITY): Payer: Commercial Managed Care - PPO

## 2015-04-17 ENCOUNTER — Inpatient Hospital Stay (HOSPITAL_COMMUNITY)
Admission: RE | Admit: 2015-04-17 | Discharge: 2015-04-18 | DRG: 470 | Disposition: A | Discharge status: Home Health | Payer: Commercial Managed Care - PPO | Source: Ambulatory Visit | Attending: Orthopedic Surgery | Admitting: Orthopedic Surgery

## 2015-04-17 ENCOUNTER — Encounter (HOSPITAL_COMMUNITY)
Admission: RE | Disposition: A | Discharge status: Home Health | Payer: Self-pay | Source: Ambulatory Visit | Attending: Orthopedic Surgery

## 2015-04-17 ENCOUNTER — Encounter (HOSPITAL_COMMUNITY): Payer: Self-pay | Admitting: *Deleted

## 2015-04-17 DIAGNOSIS — K219 Gastro-esophageal reflux disease without esophagitis: Secondary | ICD-10-CM | POA: Diagnosis present

## 2015-04-17 DIAGNOSIS — Z96649 Presence of unspecified artificial hip joint: Secondary | ICD-10-CM

## 2015-04-17 DIAGNOSIS — M169 Osteoarthritis of hip, unspecified: Secondary | ICD-10-CM | POA: Diagnosis present

## 2015-04-17 DIAGNOSIS — Z8673 Personal history of transient ischemic attack (TIA), and cerebral infarction without residual deficits: Secondary | ICD-10-CM

## 2015-04-17 DIAGNOSIS — K449 Diaphragmatic hernia without obstruction or gangrene: Secondary | ICD-10-CM | POA: Diagnosis present

## 2015-04-17 DIAGNOSIS — M1611 Unilateral primary osteoarthritis, right hip: Secondary | ICD-10-CM

## 2015-04-17 DIAGNOSIS — Z01812 Encounter for preprocedural laboratory examination: Secondary | ICD-10-CM

## 2015-04-17 DIAGNOSIS — M25551 Pain in right hip: Secondary | ICD-10-CM | POA: Diagnosis present

## 2015-04-17 DIAGNOSIS — I10 Essential (primary) hypertension: Secondary | ICD-10-CM | POA: Diagnosis present

## 2015-04-17 DIAGNOSIS — M06851 Other specified rheumatoid arthritis, right hip: Secondary | ICD-10-CM | POA: Diagnosis present

## 2015-04-17 HISTORY — PX: TOTAL HIP ARTHROPLASTY: SHX124

## 2015-04-17 LAB — COMPREHENSIVE METABOLIC PANEL
ALT: 14 U/L (ref 14–54)
ANION GAP: 9 (ref 5–15)
AST: 26 U/L (ref 15–41)
Albumin: 3.9 g/dL (ref 3.5–5.0)
Alkaline Phosphatase: 57 U/L (ref 38–126)
BUN: 9 mg/dL (ref 6–20)
CHLORIDE: 106 mmol/L (ref 101–111)
CO2: 27 mmol/L (ref 22–32)
Calcium: 8.9 mg/dL (ref 8.9–10.3)
Creatinine, Ser: 0.65 mg/dL (ref 0.44–1.00)
GFR calc Af Amer: >60 mL/min (ref >60)
Glucose, Bld: 104 mg/dL — ABNORMAL HIGH (ref 65–99)
POTASSIUM: 3.8 mmol/L (ref 3.5–5.1)
Sodium: 142 mmol/L (ref 135–145)
TOTAL PROTEIN: 6.7 g/dL (ref 6.5–8.1)
Total Bilirubin: 0.5 mg/dL (ref 0.3–1.2)

## 2015-04-17 LAB — CBC
HEMATOCRIT: 37.3 % (ref 36.0–46.0)
Hemoglobin: 12.1 g/dL (ref 12.0–15.0)
MCH: 27.8 pg (ref 26.0–34.0)
MCHC: 32.4 g/dL (ref 30.0–36.0)
MCV: 85.7 fL (ref 78.0–100.0)
PLATELETS: 167 10*3/uL (ref 150–400)
RBC: 4.35 MIL/uL (ref 3.87–5.11)
RDW: 13.6 % (ref 11.5–15.5)
WBC: 5.3 10*3/uL (ref 4.0–10.5)

## 2015-04-17 LAB — TYPE AND SCREEN
ABO/RH(D): A NEG
ANTIBODY SCREEN: NEGATIVE

## 2015-04-17 SURGERY — ARTHROPLASTY, HIP, TOTAL, ANTERIOR APPROACH
Anesthesia: Spinal | Laterality: Right

## 2015-04-17 MED ORDER — CARVEDILOL 6.25 MG PO TABS
6.2500 mg | ORAL_TABLET | Freq: Every morning | ORAL | Status: DC
  Administered 2015-04-18: 6.25 mg via ORAL
  Filled 2015-04-17: qty 1

## 2015-04-17 MED ORDER — OXYCODONE HCL 5 MG PO TABS
5.0000 mg | ORAL_TABLET | ORAL | Status: DC | PRN
  Administered 2015-04-17 – 2015-04-18 (×5): 10 mg via ORAL
  Filled 2015-04-17 (×5): qty 2

## 2015-04-17 MED ORDER — BUPIVACAINE-EPINEPHRINE (PF) 0.25% -1:200000 IJ SOLN
INTRAMUSCULAR | Status: AC
  Filled 2015-04-17: qty 30

## 2015-04-17 MED ORDER — DEXAMETHASONE SODIUM PHOSPHATE 10 MG/ML IJ SOLN
INTRAMUSCULAR | Status: AC
  Filled 2015-04-17: qty 1

## 2015-04-17 MED ORDER — HYDROCHLOROTHIAZIDE 25 MG PO TABS
25.0000 mg | ORAL_TABLET | Freq: Every morning | ORAL | Status: DC
  Administered 2015-04-18: 25 mg via ORAL
  Filled 2015-04-17: qty 1

## 2015-04-17 MED ORDER — PROPOFOL 10 MG/ML IV BOLUS
INTRAVENOUS | Status: AC
  Filled 2015-04-17: qty 20

## 2015-04-17 MED ORDER — TRANEXAMIC ACID 1000 MG/10ML IV SOLN
2000.0000 mg | INTRAVENOUS | Status: DC | PRN
  Administered 2015-04-17: 2000 mg via TOPICAL

## 2015-04-17 MED ORDER — PROPOFOL 500 MG/50ML IV EMUL
INTRAVENOUS | Status: DC | PRN
  Administered 2015-04-17: 140 ug/kg/min via INTRAVENOUS

## 2015-04-17 MED ORDER — FENTANYL CITRATE (PF) 100 MCG/2ML IJ SOLN
INTRAMUSCULAR | Status: DC | PRN
  Administered 2015-04-17: 50 ug via INTRAVENOUS

## 2015-04-17 MED ORDER — PHENYLEPHRINE 40 MCG/ML (10ML) SYRINGE FOR IV PUSH (FOR BLOOD PRESSURE SUPPORT)
PREFILLED_SYRINGE | INTRAVENOUS | Status: AC
  Filled 2015-04-17: qty 10

## 2015-04-17 MED ORDER — TRAMADOL HCL 50 MG PO TABS: 50.0000 mg | ORAL_TABLET | Freq: Four times a day (QID) | ORAL | Status: DC | PRN

## 2015-04-17 MED ORDER — PANTOPRAZOLE SODIUM 40 MG PO TBEC
40.0000 mg | DELAYED_RELEASE_TABLET | Freq: Every day | ORAL | Status: DC
  Filled 2015-04-17: qty 1

## 2015-04-17 MED ORDER — PHENYLEPHRINE HCL 10 MG/ML IJ SOLN
INTRAMUSCULAR | Status: DC | PRN
  Administered 2015-04-17: 40 ug via INTRAVENOUS

## 2015-04-17 MED ORDER — LIDOCAINE HCL (CARDIAC) 20 MG/ML IV SOLN
INTRAVENOUS | Status: AC
  Filled 2015-04-17: qty 5

## 2015-04-17 MED ORDER — FENTANYL CITRATE (PF) 100 MCG/2ML IJ SOLN
INTRAMUSCULAR | Status: AC
  Filled 2015-04-17: qty 4

## 2015-04-17 MED ORDER — MORPHINE SULFATE (PF) 2 MG/ML IV SOLN: 1.0000 mg | INTRAVENOUS | Status: DC | PRN

## 2015-04-17 MED ORDER — SODIUM CHLORIDE 0.9 % IV SOLN
10.0000 mg | INTRAVENOUS | Status: DC | PRN
  Administered 2015-04-17: 50 ug/min via INTRAVENOUS

## 2015-04-17 MED ORDER — BUPIVACAINE IN DEXTROSE 0.75-8.25 % IT SOLN
INTRATHECAL | Status: DC | PRN
  Administered 2015-04-17: 1.6 mL via INTRATHECAL

## 2015-04-17 MED ORDER — BUPIVACAINE HCL (PF) 0.25 % IJ SOLN
INTRAMUSCULAR | Status: DC | PRN
  Administered 2015-04-17: 30 mL

## 2015-04-17 MED ORDER — ONDANSETRON HCL 4 MG/2ML IJ SOLN: 4.0000 mg | Freq: Four times a day (QID) | INTRAMUSCULAR | Status: DC | PRN

## 2015-04-17 MED ORDER — METHOCARBAMOL 500 MG PO TABS
500.0000 mg | ORAL_TABLET | Freq: Four times a day (QID) | ORAL | Status: DC | PRN
  Administered 2015-04-18: 500 mg via ORAL
  Filled 2015-04-17 (×2): qty 1

## 2015-04-17 MED ORDER — PROMETHAZINE HCL 25 MG/ML IJ SOLN: 6.2500 mg | INTRAMUSCULAR | Status: DC | PRN

## 2015-04-17 MED ORDER — MIDAZOLAM HCL 5 MG/5ML IJ SOLN
INTRAMUSCULAR | Status: DC | PRN
  Administered 2015-04-17: 2 mg via INTRAVENOUS

## 2015-04-17 MED ORDER — CLINDAMYCIN PHOSPHATE 900 MG/50ML IV SOLN
INTRAVENOUS | Status: AC
  Filled 2015-04-17: qty 50

## 2015-04-17 MED ORDER — ONDANSETRON HCL 4 MG PO TABS: 4.0000 mg | ORAL_TABLET | Freq: Four times a day (QID) | ORAL | Status: DC | PRN

## 2015-04-17 MED ORDER — ACETAMINOPHEN 650 MG RE SUPP: 650.0000 mg | Freq: Four times a day (QID) | RECTAL | Status: DC | PRN

## 2015-04-17 MED ORDER — METOCLOPRAMIDE HCL 5 MG/ML IJ SOLN: 5.0000 mg | Freq: Three times a day (TID) | INTRAMUSCULAR | Status: DC | PRN

## 2015-04-17 MED ORDER — ACETAMINOPHEN 10 MG/ML IV SOLN
INTRAVENOUS | Status: AC
  Filled 2015-04-17: qty 100

## 2015-04-17 MED ORDER — DIPHENHYDRAMINE HCL 12.5 MG/5ML PO ELIX: 12.5000 mg | ORAL_SOLUTION | ORAL | Status: DC | PRN

## 2015-04-17 MED ORDER — ACETAMINOPHEN 10 MG/ML IV SOLN
1000.0000 mg | Freq: Once | INTRAVENOUS | Status: AC
  Administered 2015-04-17: 1000 mg via INTRAVENOUS
  Filled 2015-04-17: qty 100

## 2015-04-17 MED ORDER — LIDOCAINE HCL (CARDIAC) 20 MG/ML IV SOLN
INTRAVENOUS | Status: DC | PRN
  Administered 2015-04-17: 40 mg via INTRAVENOUS

## 2015-04-17 MED ORDER — TRANEXAMIC ACID 1000 MG/10ML IV SOLN
2000.0000 mg | Freq: Once | INTRAVENOUS | Status: DC
  Filled 2015-04-17: qty 20

## 2015-04-17 MED ORDER — DEXAMETHASONE SODIUM PHOSPHATE 10 MG/ML IJ SOLN
10.0000 mg | Freq: Once | INTRAMUSCULAR | Status: AC
  Administered 2015-04-17: 10 mg via INTRAVENOUS

## 2015-04-17 MED ORDER — HYDROMORPHONE HCL 1 MG/ML IJ SOLN: 0.2500 mg | INTRAMUSCULAR | Status: DC | PRN

## 2015-04-17 MED ORDER — MENTHOL 3 MG MT LOZG: 1.0000 | LOZENGE | OROMUCOSAL | Status: DC | PRN

## 2015-04-17 MED ORDER — POTASSIUM CHLORIDE IN NACL 20-0.9 MEQ/L-% IV SOLN
INTRAVENOUS | Status: DC
  Filled 2015-04-17 (×2): qty 1000

## 2015-04-17 MED ORDER — ACETAMINOPHEN 500 MG PO TABS
1000.0000 mg | ORAL_TABLET | Freq: Four times a day (QID) | ORAL | Status: DC
  Administered 2015-04-17 – 2015-04-18 (×2): 1000 mg via ORAL
  Filled 2015-04-17 (×4): qty 2

## 2015-04-17 MED ORDER — DEXAMETHASONE SODIUM PHOSPHATE 10 MG/ML IJ SOLN
10.0000 mg | Freq: Once | INTRAMUSCULAR | Status: DC
  Filled 2015-04-17: qty 1

## 2015-04-17 MED ORDER — PHENYLEPHRINE HCL 10 MG/ML IJ SOLN
INTRAMUSCULAR | Status: AC
  Filled 2015-04-17: qty 1

## 2015-04-17 MED ORDER — PHENOL 1.4 % MT LIQD
1.0000 | OROMUCOSAL | Status: DC | PRN
  Filled 2015-04-17: qty 177

## 2015-04-17 MED ORDER — HYDROMORPHONE HCL 1 MG/ML IJ SOLN
INTRAMUSCULAR | Status: AC
  Filled 2015-04-17: qty 1

## 2015-04-17 MED ORDER — RIVAROXABAN 10 MG PO TABS
10.0000 mg | ORAL_TABLET | Freq: Every day | ORAL | Status: DC
  Administered 2015-04-18: 10 mg via ORAL
  Filled 2015-04-17 (×2): qty 1

## 2015-04-17 MED ORDER — ONDANSETRON HCL 4 MG/2ML IJ SOLN
INTRAMUSCULAR | Status: AC
  Filled 2015-04-17: qty 2

## 2015-04-17 MED ORDER — SODIUM CHLORIDE 0.9 % IV SOLN: 30.0000 ug/min | INTRAVENOUS | Status: DC

## 2015-04-17 MED ORDER — ACETAMINOPHEN 325 MG PO TABS: 650.0000 mg | ORAL_TABLET | Freq: Four times a day (QID) | ORAL | Status: DC | PRN

## 2015-04-17 MED ORDER — 0.9 % SODIUM CHLORIDE (POUR BTL) OPTIME
TOPICAL | Status: DC | PRN
  Administered 2015-04-17: 1000 mL

## 2015-04-17 MED ORDER — SODIUM CHLORIDE 0.9 % IV SOLN: INTRAVENOUS | Status: DC

## 2015-04-17 MED ORDER — LACTATED RINGERS IV SOLN
INTRAVENOUS | Status: DC
Start: 2015-04-17 — End: 2015-04-17
  Administered 2015-04-17: 16:00:00 via INTRAVENOUS
  Administered 2015-04-17: 1000 mL via INTRAVENOUS

## 2015-04-17 MED ORDER — CHLORHEXIDINE GLUCONATE 4 % EX LIQD: 60.0000 mL | Freq: Once | CUTANEOUS | Status: DC

## 2015-04-17 MED ORDER — DOCUSATE SODIUM 100 MG PO CAPS
100.0000 mg | ORAL_CAPSULE | Freq: Two times a day (BID) | ORAL | Status: DC
  Administered 2015-04-17 – 2015-04-18 (×2): 100 mg via ORAL

## 2015-04-17 MED ORDER — HYDROMORPHONE HCL 1 MG/ML IJ SOLN
0.2500 mg | INTRAMUSCULAR | Status: DC | PRN
  Administered 2015-04-17 (×2): 0.5 mg via INTRAVENOUS

## 2015-04-17 MED ORDER — STERILE WATER FOR IRRIGATION IR SOLN
Status: DC | PRN
  Administered 2015-04-17: 1000 mL

## 2015-04-17 MED ORDER — POLYETHYLENE GLYCOL 3350 17 G PO PACK: 17.0000 g | PACK | Freq: Every day | ORAL | Status: DC | PRN

## 2015-04-17 MED ORDER — CLINDAMYCIN PHOSPHATE 900 MG/50ML IV SOLN
900.0000 mg | INTRAVENOUS | Status: AC
  Administered 2015-04-17: 900 mg via INTRAVENOUS

## 2015-04-17 MED ORDER — ONDANSETRON HCL 4 MG/2ML IJ SOLN
INTRAMUSCULAR | Status: DC | PRN
  Administered 2015-04-17: 4 mg via INTRAVENOUS

## 2015-04-17 MED ORDER — METOCLOPRAMIDE HCL 10 MG PO TABS: 5.0000 mg | ORAL_TABLET | Freq: Three times a day (TID) | ORAL | Status: DC | PRN

## 2015-04-17 MED ORDER — BISACODYL 10 MG RE SUPP: 10.0000 mg | Freq: Every day | RECTAL | Status: DC | PRN

## 2015-04-17 MED ORDER — MIDAZOLAM HCL 2 MG/2ML IJ SOLN
INTRAMUSCULAR | Status: AC
  Filled 2015-04-17: qty 4

## 2015-04-17 MED ORDER — PROPOFOL 500 MG/50ML IV EMUL: INTRAVENOUS | Status: DC | PRN

## 2015-04-17 MED ORDER — CLINDAMYCIN PHOSPHATE 600 MG/50ML IV SOLN
600.0000 mg | Freq: Four times a day (QID) | INTRAVENOUS | Status: AC
  Administered 2015-04-17 – 2015-04-18 (×2): 600 mg via INTRAVENOUS
  Filled 2015-04-17 (×2): qty 50

## 2015-04-17 MED ORDER — DEXTROSE 5 % IV SOLN
500.0000 mg | Freq: Four times a day (QID) | INTRAVENOUS | Status: DC | PRN
Start: 2015-04-17 — End: 2015-04-18
  Administered 2015-04-17: 500 mg via INTRAVENOUS
  Filled 2015-04-17 (×2): qty 5

## 2015-04-17 MED ORDER — FLEET ENEMA 7-19 GM/118ML RE ENEM: 1.0000 | ENEMA | Freq: Once | RECTAL | Status: DC | PRN

## 2015-04-17 SURGICAL SUPPLY — 45 items
BAG DECANTER FOR FLEXI CONT (MISCELLANEOUS) ×3 IMPLANT
BAG ZIPLOCK 12X15 (MISCELLANEOUS) IMPLANT
BLADE EXTENDED COATED 6.5IN (ELECTRODE) ×3 IMPLANT
BLADE SAG 18X100X1.27 (BLADE) ×3 IMPLANT
CAPT HIP TOTAL 2 ×3 IMPLANT
CLOSURE WOUND 1/2 X4 (GAUZE/BANDAGES/DRESSINGS) ×1
COVER PERINEAL POST (MISCELLANEOUS) ×3 IMPLANT
DECANTER SPIKE VIAL GLASS SM (MISCELLANEOUS) ×3 IMPLANT
DRAPE C-ARM 42X120 X-RAY (DRAPES) ×3 IMPLANT
DRAPE STERI IOBAN 125X83 (DRAPES) ×3 IMPLANT
DRAPE U-SHAPE 47X51 STRL (DRAPES) ×9 IMPLANT
DRSG ADAPTIC 3X8 NADH LF (GAUZE/BANDAGES/DRESSINGS) ×3 IMPLANT
DRSG MEPILEX BORDER 4X4 (GAUZE/BANDAGES/DRESSINGS) ×3 IMPLANT
DRSG MEPILEX BORDER 4X8 (GAUZE/BANDAGES/DRESSINGS) ×3 IMPLANT
DRSG MEPITEL 4X7.2 (GAUZE/BANDAGES/DRESSINGS) ×3 IMPLANT
DURAPREP 26ML APPLICATOR (WOUND CARE) ×3 IMPLANT
ELECT REM PT RETURN 9FT ADLT (ELECTROSURGICAL) ×3
ELECTRODE REM PT RTRN 9FT ADLT (ELECTROSURGICAL) ×1 IMPLANT
EVACUATOR 1/8 PVC DRAIN (DRAIN) ×3 IMPLANT
FACESHIELD WRAPAROUND (MASK) ×12 IMPLANT
GLOVE BIO SURGEON STRL SZ7.5 (GLOVE) ×3 IMPLANT
GLOVE BIO SURGEON STRL SZ8 (GLOVE) ×6 IMPLANT
GLOVE BIOGEL PI IND STRL 8 (GLOVE) ×2 IMPLANT
GLOVE BIOGEL PI INDICATOR 8 (GLOVE) ×4
GOWN STRL REUS W/TWL LRG LVL3 (GOWN DISPOSABLE) ×3 IMPLANT
GOWN STRL REUS W/TWL XL LVL3 (GOWN DISPOSABLE) ×3 IMPLANT
KIT BASIN OR (CUSTOM PROCEDURE TRAY) ×3 IMPLANT
NDL SAFETY ECLIPSE 18X1.5 (NEEDLE) ×1 IMPLANT
NEEDLE HYPO 18GX1.5 SHARP (NEEDLE) ×2
PACK TOTAL JOINT (CUSTOM PROCEDURE TRAY) ×3 IMPLANT
PEN SKIN MARKING BROAD (MISCELLANEOUS) ×3 IMPLANT
STRIP CLOSURE SKIN 1/2X4 (GAUZE/BANDAGES/DRESSINGS) ×2 IMPLANT
SUT ETHIBOND NAB CT1 #1 30IN (SUTURE) ×3 IMPLANT
SUT MNCRL AB 4-0 PS2 18 (SUTURE) ×3 IMPLANT
SUT VIC AB 2-0 CT1 27 (SUTURE) ×4
SUT VIC AB 2-0 CT1 TAPERPNT 27 (SUTURE) ×2 IMPLANT
SUT VLOC 180 0 24IN GS25 (SUTURE) ×3 IMPLANT
SYR 30ML LL (SYRINGE) ×3 IMPLANT
SYR 50ML LL SCALE MARK (SYRINGE) IMPLANT
TAPE STRIPS DRAPE STRL (GAUZE/BANDAGES/DRESSINGS) ×3 IMPLANT
TOWEL OR 17X26 10 PK STRL BLUE (TOWEL DISPOSABLE) ×3 IMPLANT
TOWEL OR NON WOVEN STRL DISP B (DISPOSABLE) IMPLANT
TRAY FOLEY W/METER SILVER 14FR (SET/KITS/TRAYS/PACK) ×3 IMPLANT
TRAY FOLEY W/METER SILVER 16FR (SET/KITS/TRAYS/PACK) ×3 IMPLANT
YANKAUER SUCT BULB TIP 10FT TU (MISCELLANEOUS) IMPLANT

## 2015-04-17 NOTE — Anesthesia Preprocedure Evaluation (Addendum)
Anesthesia Evaluation  Patient identified by MRN, date of birth, ID band Patient awake    Reviewed: Allergy & Precautions, NPO status , Patient's Chart, lab work & pertinent test results  Airway Mallampati: II  TM Distance: >3 FB Neck ROM: Full    Dental no notable dental hx.    Pulmonary neg pulmonary ROS,    Pulmonary exam normal breath sounds clear to auscultation       Cardiovascular Exercise Tolerance: Good hypertension, Pt. on medications and Pt. on home beta blockers Normal cardiovascular exam Rhythm:Regular Rate:Normal     Neuro/Psych CVA negative psych ROS   GI/Hepatic Neg liver ROS, hiatal hernia, GERD  Medicated,  Endo/Other  negative endocrine ROS  Renal/GU negative Renal ROS  negative genitourinary   Musculoskeletal  (+) Arthritis ,   Abdominal   Peds negative pediatric ROS (+)  Hematology negative hematology ROS (+)   Anesthesia Other Findings   Reproductive/Obstetrics negative OB ROS                             Anesthesia Physical Anesthesia Plan  ASA: III  Anesthesia Plan: Spinal   Post-op Pain Management:    Induction: Intravenous  Airway Management Planned: Natural Airway  Additional Equipment:   Intra-op Plan:   Post-operative Plan:   Informed Consent: I have reviewed the patients History and Physical, chart, labs and discussed the procedure including the risks, benefits and alternatives for the proposed anesthesia with the patient or authorized representative who has indicated his/her understanding and acceptance.   Dental advisory given  Plan Discussed with: CRNA  Anesthesia Plan Comments: (Discussed risks and benefits of and differences between spinal and general. Discussed risks of spinal including headache, backache, failure, bleeding and hematoma, infection, and nerve damage and paralysis. Patient consents to spinal. Questions answered. Coagulation  studies and platelet count acceptable. )       Anesthesia Quick Evaluation

## 2015-04-17 NOTE — Transfer of Care (Signed)
Immediate Anesthesia Transfer of Care Note  Patient: [name]  Procedure(s) Performed: Procedure(s): RIGHT TOTAL HIP ARTHROPLASTY ANTERIOR APPROACH (Right)  Patient Location: PACU  Anesthesia Type:Spinal  Level of Consciousness: awake, alert  and oriented  Airway & Oxygen Therapy: Patient Spontanous Breathing and Patient connected to face mask oxygen  Post-op Assessment: Report given to RN and Post -op Vital signs reviewed and stable  Post vital signs: Reviewed and stable  Last Vitals:  Filed Vitals:   04/17/15 1126  BP: 155/86  Pulse: 85  Temp: 36.5 C  Resp: 18    Complications: No apparent anesthesia complications

## 2015-04-17 NOTE — Op Note (Signed)
OPERATIVE REPORT  PREOPERATIVE DIAGNOSIS: Rheumatoid arthritis of the Right hip.   POSTOPERATIVE DIAGNOSIS: Rheumatoid arthritis of the Right  hip.   PROCEDURE: Right total hip arthroplasty, anterior approach.   SURGEON: Gaynelle Arabian, MD   ASSISTANT: Arlee Muslim, PA-C  ANESTHESIA:  Spinal  ESTIMATED BLOOD LOSS:-200 ml    DRAINS: Hemovac x1.   COMPLICATIONS: None   CONDITION: PACU - hemodynamically stable.   BRIEF CLINICAL NOTE: NAIOMI Gomez is a 69 y.o. female who has advanced end-  stage arthritis of her Right  hip with progressively worsening pain and  dysfunction.The patient has failed nonoperative management and presents for  total hip arthroplasty.   PROCEDURE IN DETAIL: After successful administration of spinal  anesthetic, the traction boots for the [facility] bed were placed on both  feet and the patient was placed onto the [facility] bed, boots placed into the leg  holders. The Right hip was then isolated from the perineum with plastic  drapes and prepped and draped in the usual sterile fashion. ASIS and  greater trochanter were marked and a oblique incision was made, starting  at about 1 cm lateral and 2 cm distal to the ASIS and coursing towards  the anterior cortex of the femur. The skin was cut with a 10 blade  through subcutaneous tissue to the level of the fascia overlying the  tensor fascia lata muscle. The fascia was then incised in line with the  incision at the junction of the anterior third and posterior 2/3rd. The  muscle was teased off the fascia and then the interval between the TFL  and the rectus was developed. The Hohmann retractor was then placed at  the top of the femoral neck over the capsule. The vessels overlying the  capsule were cauterized and the fat on top of the capsule was removed.  A Hohmann retractor was then placed anterior underneath the rectus  femoris to give exposure to the entire anterior capsule. A T-shaped  capsulotomy  was performed. The edges were tagged and the femoral head  was identified.       Osteophytes are removed off the superior acetabulum.  The femoral neck was then cut in situ with an oscillating saw. Traction  was then applied to the left lower extremity utilizing the [facility]  traction. The femoral head was then removed. Retractors were placed  around the acetabulum and then circumferential removal of the labrum was  performed. Osteophytes were also removed. Reaming starts at 43 mm to  medialize and  Increased in 2 mm increments to 47 mm. We reamed in  approximately 40 degrees of abduction, 20 degrees anteversion. A 48 mm  pinnacle acetabular shell was then impacted in anatomic position under  fluoroscopic guidance with excellent purchase. We did not need to place  any additional dome screws. A 28 mm neutral + 4 marathon liner was then  placed into the acetabular shell.       The femoral lift was then placed along the lateral aspect of the femur  just distal to the vastus ridge. The leg was  externally rotated and capsule  was stripped off the inferior aspect of the femoral neck down to the  level of the lesser trochanter, this was done with electrocautery. The femur was lifted after this was performed. The  leg was then placed and extended in adducted position to essentially delivering the femur. We also removed the capsule superiorly and the  piriformis  from the piriformis fossa to gain excellent exposure of the  proximal femur. Rongeur was used to remove some cancellous bone to get  into the lateral portion of the proximal femur for placement of the  initial starter reamer. The starter broaches was placed  the starter broach  and was shown to go down the center of the canal. Broaching  with the  Corail system was then performed starting at size 8, coursing  Up to size 9. A size 9 had excellent torsional and rotational  and axial stability. The trial standard offset neck was then placed  with  a 28 + 1.5 trial head. The hip was then reduced. We confirmed that  the stem was in the canal both on AP and lateral x-rays. It also has excellent sizing. The hip was reduced with outstanding stability through full extension, full external rotation,  and then flexion in adduction internal rotation. AP pelvis was taken  and the leg lengths were measured and found to be exactly equal. Hip  was then dislocated again and the femoral head and neck removed. The  femoral broach was removed. Size 9 Corail stem with a standard offset  neck was then impacted into the femur following native anteversion. Has  excellent purchase in the canal. Excellent torsional and rotational and  axial stability. It is confirmed to be in the canal on AP and lateral  fluoroscopic views. The 28 + 1.5 ceramic head was placed and the hip  reduced with outstanding stability. Again AP pelvis was taken and it  confirmed that the leg lengths were equal. The wound was then copiously  irrigated with saline solution and the capsule reattached and repaired  with Ethibond suture.30 ml of .25% Bupivicaine injected into the capsule and into the edge of the tensor fascia lata as well as subcutaneous tissue. The fascia overlying the tensor fascia lata was  then closed with a running #1 V-Loc. Subcu was closed with interrupted  2-0 Vicryl and subcuticular running 4-0 Monocryl. Incision was cleaned  and dried. Steri-Strips and a bulky sterile dressing applied. Hemovac  drain was hooked to suction and then he was awakened and transported to  recovery in stable condition.        Please note that a surgical assistant was a medical necessity for this procedure to perform it in a safe and expeditious manner. Assistant was necessary to provide appropriate retraction of vital neurovascular structures and to prevent femoral fracture and allow for anatomic placement of the prosthesis.  Gaynelle Arabian, M.D.

## 2015-04-17 NOTE — Progress Notes (Signed)
Utilization review completed.  

## 2015-04-17 NOTE — Anesthesia Postprocedure Evaluation (Signed)
  Anesthesia Post-op Note  Patient: [name]  Procedure(s) Performed: Procedure(s) (LRB): RIGHT TOTAL HIP ARTHROPLASTY ANTERIOR APPROACH (Right)  Patient Location: PACU  Anesthesia Type: Spinal  Level of Consciousness: awake and alert   Airway and Oxygen Therapy: Patient Spontanous Breathing  Post-op Pain: mild  Post-op Assessment: Post-op Vital signs reviewed, Patient's Cardiovascular Status Stable, Respiratory Function Stable, Patent Airway and No signs of Nausea or vomiting. Bends at knee bilaterally in PACU. Spinal resolving.  Last Vitals:  Filed Vitals:   04/17/15 1730  BP: 145/69  Pulse: 68  Temp: 36.3 C  Resp: 12    Post-op Vital Signs: stable   Complications: No apparent anesthesia complications

## 2015-04-17 NOTE — Anesthesia Procedure Notes (Addendum)
Spinal  Start time: 04/17/2015 2:44 PM End time: 04/17/2015 2:55 PM Staffing Anesthesiologist: Franne Grip Resident/CRNA: Danley Danker L Performed by: anesthesiologist and resident/CRNA  Preanesthetic Checklist Completed: patient identified, site marked, surgical consent, pre-op evaluation, timeout performed, IV checked, risks and benefits discussed and monitors and equipment checked Spinal Block Patient position: sitting Prep: Betadine Patient monitoring: heart rate, continuous pulse ox and blood pressure Approach: midline Location: L2-3 Injection technique: single-shot Needle Needle gauge: 22 G Needle length: 9 cm Assessment Sensory level: T6 Additional Notes Kit expiration date 08/12/2016 and lot # [id number] Negative CSF, negative heme, negative paresthesia Tolerated well and returned to supine position

## 2015-04-17 NOTE — Interval H&P Note (Signed)
History and Physical Interval Note:  04/17/2015 1:52 PM  [name]  has presented today for surgery, with the diagnosis of OA RIGHT HIP  The various methods of treatment have been discussed with the patient and family. After consideration of risks, benefits and other options for treatment, the patient has consented to  Procedure(s): RIGHT TOTAL HIP ARTHROPLASTY ANTERIOR APPROACH (Right) as a surgical intervention .  The patient's history has been reviewed, patient examined, no change in status, stable for surgery.  I have reviewed the patient's chart and labs.  Questions were answered to the patient's satisfaction.     [name]

## 2015-04-18 ENCOUNTER — Encounter (HOSPITAL_COMMUNITY): Payer: Self-pay | Admitting: Orthopedic Surgery

## 2015-04-18 LAB — CBC
HCT: 31.7 % — ABNORMAL LOW (ref 36.0–46.0)
Hemoglobin: 10.4 g/dL — ABNORMAL LOW (ref 12.0–15.0)
MCH: 28.2 pg (ref 26.0–34.0)
MCHC: 32.8 g/dL (ref 30.0–36.0)
MCV: 85.9 fL (ref 78.0–100.0)
PLATELETS: 149 10*3/uL — AB (ref 150–400)
RBC: 3.69 MIL/uL — AB (ref 3.87–5.11)
RDW: 13.7 % (ref 11.5–15.5)
WBC: 6.8 10*3/uL (ref 4.0–10.5)

## 2015-04-18 LAB — BASIC METABOLIC PANEL
ANION GAP: 8 (ref 5–15)
BUN: 10 mg/dL (ref 6–20)
CALCIUM: 8.4 mg/dL — AB (ref 8.9–10.3)
CO2: 23 mmol/L (ref 22–32)
Chloride: 106 mmol/L (ref 101–111)
Creatinine, Ser: 0.73 mg/dL (ref 0.44–1.00)
Glucose, Bld: 198 mg/dL — ABNORMAL HIGH (ref 65–99)
POTASSIUM: 3.7 mmol/L (ref 3.5–5.1)
Sodium: 137 mmol/L (ref 135–145)

## 2015-04-18 MED ORDER — OMEPRAZOLE 20 MG PO CPDR
20.0000 mg | DELAYED_RELEASE_CAPSULE | Freq: Every day | ORAL | Status: DC
  Administered 2015-04-18: 20 mg via ORAL
  Filled 2015-04-18: qty 1

## 2015-04-18 MED ORDER — TRAMADOL HCL 50 MG PO TABS: 50.0000 mg | ORAL_TABLET | Freq: Four times a day (QID) | ORAL | Status: DC | PRN

## 2015-04-18 MED ORDER — NON FORMULARY: 20.0000 mg | Freq: Every morning | Status: DC

## 2015-04-18 MED ORDER — OXYCODONE HCL 5 MG PO TABS: 5.0000 mg | ORAL_TABLET | ORAL | Status: DC | PRN

## 2015-04-18 MED ORDER — RIVAROXABAN 10 MG PO TABS: 10.0000 mg | ORAL_TABLET | Freq: Every day | ORAL | Status: DC

## 2015-04-18 MED ORDER — METHOCARBAMOL 500 MG PO TABS: 500.0000 mg | ORAL_TABLET | Freq: Four times a day (QID) | ORAL | Status: DC | PRN

## 2015-04-18 NOTE — Discharge Summary (Signed)
Physician Discharge Summary   Patient ID: [name] MRN: [id number] DOB/AGE: [birth year] 71 y.o.  Admit date: 04/17/2015 Discharge date: 04-18-2015  Primary Diagnosis:  Rheumatoid arthritis of the Right hip.   Admission Diagnoses:  Past Medical History  Diagnosis Date  . Hypertension   . Stroke [facility])     hx of mini strokes - 8 years ago - no deficits   . GERD (gastroesophageal reflux disease)   . History of hiatal hernia   . Arthritis     rheumatoid arthritis    Discharge Diagnoses:   Principal Problem:   OA (osteoarthritis) of hip  Estimated body mass index is 26.85 kg/(m^2) as calculated from the following:   Height as of this encounter: 5' 3.5" (1.613 m).   Weight as of this encounter: 69.854 kg (154 lb).  Procedure(s) (LRB): RIGHT TOTAL HIP ARTHROPLASTY ANTERIOR APPROACH (Right)   Consults: None  HPI: [name] is a 69 y.o. female who has advanced end-  stage arthritis of her Right hip with progressively worsening pain and  dysfunction.The patient has failed nonoperative management and presents for  total hip arthroplasty.  Laboratory Data: Admission on 04/17/2015  Component Date Value Ref Range Status  . WBC 04/17/2015 5.3  4.0 - 10.5 K/uL Final  . RBC 04/17/2015 4.35  3.87 - 5.11 MIL/uL Final  . Hemoglobin 04/17/2015 12.1  12.0 - 15.0 g/dL Final  . HCT 04/17/2015 37.3  36.0 - 46.0 % Final  . MCV 04/17/2015 85.7  78.0 - 100.0 fL Final  . MCH 04/17/2015 27.8  26.0 - 34.0 pg Final  . MCHC 04/17/2015 32.4  30.0 - 36.0 g/dL Final  . RDW 04/17/2015 13.6  11.5 - 15.5 % Final  . Platelets 04/17/2015 167  150 - 400 K/uL Final  . Sodium 04/17/2015 142  135 - 145 mmol/L Final  . Potassium 04/17/2015 3.8  3.5 - 5.1 mmol/L Final  . Chloride 04/17/2015 106  101 - 111 mmol/L Final  . CO2 04/17/2015 27  22 - 32 mmol/L Final  . Glucose, Bld 04/17/2015 104* 65 - 99 mg/dL Final  . BUN 04/17/2015 9  6 - 20 mg/dL Final  . Creatinine, Ser 04/17/2015 0.65  0.44 -  1.00 mg/dL Final  . Calcium 04/17/2015 8.9  8.9 - 10.3 mg/dL Final  . Total Protein 04/17/2015 6.7  6.5 - 8.1 g/dL Final  . Albumin 04/17/2015 3.9  3.5 - 5.0 g/dL Final  . AST 04/17/2015 26  15 - 41 U/L Final  . ALT 04/17/2015 14  14 - 54 U/L Final  . Alkaline Phosphatase 04/17/2015 57  38 - 126 U/L Final  . Total Bilirubin 04/17/2015 0.5  0.3 - 1.2 mg/dL Final  . GFR calc non Af Amer 04/17/2015 >60  >60 mL/min Final  . GFR calc Af Amer 04/17/2015 >60  >60 mL/min Final   Comment: (NOTE) The eGFR has been calculated using the CKD EPI equation. This calculation has not been validated in all clinical situations. eGFR's persistently <60 mL/min signify possible Chronic Kidney Disease.   . Anion gap 04/17/2015 9  5 - 15 Final  . WBC 04/18/2015 6.8  4.0 - 10.5 K/uL Final  . RBC 04/18/2015 3.69* 3.87 - 5.11 MIL/uL Final  . Hemoglobin 04/18/2015 10.4* 12.0 - 15.0 g/dL Final  . HCT 04/18/2015 31.7* 36.0 - 46.0 % Final  . MCV 04/18/2015 85.9  78.0 - 100.0 fL Final  . MCH 04/18/2015 28.2  26.0 - 34.0 pg Final  .  MCHC 04/18/2015 32.8  30.0 - 36.0 g/dL Final  . RDW 04/18/2015 13.7  11.5 - 15.5 % Final  . Platelets 04/18/2015 149* 150 - 400 K/uL Final  . Sodium 04/18/2015 137  135 - 145 mmol/L Final  . Potassium 04/18/2015 3.7  3.5 - 5.1 mmol/L Final  . Chloride 04/18/2015 106  101 - 111 mmol/L Final  . CO2 04/18/2015 23  22 - 32 mmol/L Final  . Glucose, Bld 04/18/2015 198* 65 - 99 mg/dL Final  . BUN 04/18/2015 10  6 - 20 mg/dL Final  . Creatinine, Ser 04/18/2015 0.73  0.44 - 1.00 mg/dL Final  . Calcium 04/18/2015 8.4* 8.9 - 10.3 mg/dL Final  . GFR calc non Af Amer 04/18/2015 >60  >60 mL/min Final  . GFR calc Af Amer 04/18/2015 >60  >60 mL/min Final   Comment: (NOTE) The eGFR has been calculated using the CKD EPI equation. This calculation has not been validated in all clinical situations. eGFR's persistently <60 mL/min signify possible Chronic Kidney Disease.   Georgiann Hahn gap 04/18/2015 8  5  - 15 Final  Hospital Outpatient Visit on 04/10/2015  Component Date Value Ref Range Status  . aPTT 04/10/2015 29  24 - 37 seconds Final  . Prothrombin Time 04/10/2015 13.4  11.6 - 15.2 seconds Final  . INR 04/10/2015 1.00  0.00 - 1.49 Final  . ABO/RH(D) 04/10/2015 A NEG   Final  . Antibody Screen 04/10/2015 NEG   Final  . Sample Expiration 04/10/2015 04/20/2015   Final  . Color, Urine 04/10/2015 YELLOW  YELLOW Final  . APPearance 04/10/2015 CLEAR  CLEAR Final  . Specific Gravity, Urine 04/10/2015 1.008  1.005 - 1.030 Final  . pH 04/10/2015 7.5  5.0 - 8.0 Final  . Glucose, UA 04/10/2015 NEGATIVE  NEGATIVE mg/dL Final  . Hgb urine dipstick 04/10/2015 NEGATIVE  NEGATIVE Final  . Bilirubin Urine 04/10/2015 NEGATIVE  NEGATIVE Final  . Ketones, ur 04/10/2015 NEGATIVE  NEGATIVE mg/dL Final  . Protein, ur 04/10/2015 NEGATIVE  NEGATIVE mg/dL Final  . Urobilinogen, UA 04/10/2015 0.2  0.0 - 1.0 mg/dL Final  . Nitrite 04/10/2015 NEGATIVE  NEGATIVE Final  . Leukocytes, UA 04/10/2015 NEGATIVE  NEGATIVE Final   MICROSCOPIC NOT DONE ON URINES WITH NEGATIVE PROTEIN, BLOOD, LEUKOCYTES, NITRITE, OR GLUCOSE <1000 mg/dL.  Marland Kitchen MRSA, PCR 04/10/2015 NEGATIVE  NEGATIVE Final  . Staphylococcus aureus 04/10/2015 NEGATIVE  NEGATIVE Final   Comment:        The Xpert SA Assay (FDA approved for NASAL specimens in patients over 72 years of age), is one component of a comprehensive surveillance program.  Test performance has been validated by [facility] for patients greater than or equal to 46 year old. It is not intended to diagnose infection nor to guide or monitor treatment.      X-Rays:Dg Pelvis Portable  04/17/2015   CLINICAL DATA:  Status post right hip joint prosthesis placement.  EXAM: DG C-ARM 1-60 MIN-NO REPORT; PORTABLE PELVIS 1-2 VIEWS  COMPARISON:  Abdominal and pelvic CT scan dated February 18, 2015  FINDINGS: The patient has undergone low left hip arthroplasty. Radiographic positioning of the  prosthetic components is good. The interface of the prosthesis with the native bone is normal. A pre-existing left hip prosthesis is present. A surgical drain line is present on the right at the level of the hip joint.  IMPRESSION: There is no immediate postprocedure complication following lefthip joint prosthesis placement.   Electronically Signed   By: Shanon Brow  [place] M.D.   On: 04/17/2015 17:07   Dg C-arm 1-60 Min-no Report  04/17/2015   CLINICAL DATA: surgery   C-ARM 1-60 MINUTES  Fluoroscopy was utilized by the requesting physician.  No radiographic  interpretation.     EKG: Orders placed or performed in visit on 05/14/11  . EKG 12-Lead     Hospital Course: Patient was admitted to [facility] and taken to the OR and underwent the above state procedure without complications.  Patient tolerated the procedure well and was later transferred to the recovery room and then to the orthopaedic floor for postoperative care.  They were given PO and IV analgesics for pain control following their surgery.  They were given 24 hours of postoperative antibiotics of  Anti-infectives    Start     Dose/Rate Route Frequency Ordered Stop   04/17/15 2200  clindamycin (CLEOCIN) IVPB 600 mg     600 mg 100 mL/hr over 30 Minutes Intravenous Every 6 hours 04/17/15 1801 04/18/15 0515   04/17/15 1153  clindamycin (CLEOCIN) IVPB 900 mg     900 mg 100 mL/hr over 30 Minutes Intravenous On call to O.R. 04/17/15 1153 04/17/15 1529   03/21/15 1200  ceFAZolin (ANCEF) IVPB 2 g/50 mL premix  Status:  Discontinued     2 g 100 mL/hr over 30 Minutes Intravenous On call to O.R. 03/21/15 1156 03/21/15 1157   03/21/15 1156  ceFAZolin (ANCEF) IVPB 2 g/50 mL premix  Status:  Discontinued     2 g 100 mL/hr over 30 Minutes Intravenous On call to O.R. 03/21/15 1156 03/21/15 1157     and started on DVT prophylaxis in the form of Xarelto.   PT and OT were ordered for total hip protocol.  The patient was allowed to be WBAT with  therapy. Discharge planning was consulted to help with postop disposition and equipment needs.  Patient had a good night on the evening of surgery.  They started to get up OOB with therapy on day one.  Hemovac drain was pulled without difficulty. Patient was seen in rounds and was ready to go home later that day following therapy.  Diet - Cardiac diet Follow up - in 2 weeks Activity - WBAT Disposition - Home Condition Upon Discharge - good D/C Meds - See DC Summary DVT Prophylaxis - Xarelto   Discharge Instructions    Call MD / Call 911    Complete by:  As directed   If you experience chest pain or shortness of breath, CALL 911 and be transported to the hospital emergency room.  If you develope a fever above 101 F, pus (white drainage) or increased drainage or redness at the wound, or calf pain, call your surgeon's office.     Change dressing    Complete by:  As directed   You may change your dressing dressing daily with sterile 4 x 4 inch gauze dressing and paper tape.  Do not submerge the incision under water.     Constipation Prevention    Complete by:  As directed   Drink plenty of fluids.  Prune juice may be helpful.  You may use a stool softener, such as Colace (over the counter) 100 mg twice a day.  Use MiraLax (over the counter) for constipation as needed.     Diet - low sodium heart healthy    Complete by:  As directed      Discharge instructions    Complete by:  As directed  Pick up stool softner and laxative for home use following surgery while on pain medications. Do not submerge incision under water. May remove the surgical dressing on Friday 04/19/2015 and then apply a dry guaze dressing to the incision daily. Please use good hand washing techniques while changing dressing each day. May shower starting three days after surgery on Saturday 04/20/2015. Please use a clean towel to pat the incision dry following showers. Continue to use ice for pain and swelling after  surgery. Do not use any lotions or creams on the incision until instructed by your surgeon.  Total Hip Protocol.  Take Xarelto for two and a half more weeks, then discontinue Xarelto. Once the patient has completed the Xarelto, they may resume the 325 mg Aspirin.  Postoperative Constipation Protocol  Constipation - defined medically as fewer than three stools per week and severe constipation as less than one stool per week.  One of the most common issues patients have following surgery is constipation.  Even if you have a regular bowel pattern at home, your normal regimen is likely to be disrupted due to multiple reasons following surgery.  Combination of anesthesia, postoperative narcotics, change in appetite and fluid intake all can affect your bowels.  In order to avoid complications following surgery, here are some recommendations in order to help you during your recovery period.  Colace (docusate) - Pick up an over-the-counter form of Colace or another stool softener and take twice a day as long as you are requiring postoperative pain medications.  Take with a full glass of water daily.  If you experience loose stools or diarrhea, hold the colace until you stool forms back up.  If your symptoms do not get better within 1 week or if they get worse, check with your doctor.  Dulcolax (bisacodyl) - Pick up over-the-counter and take as directed by the product packaging as needed to assist with the movement of your bowels.  Take with a full glass of water.  Use this product as needed if not relieved by Colace only.   MiraLax (polyethylene glycol) - Pick up over-the-counter to have on hand.  MiraLax is a solution that will increase the amount of water in your bowels to assist with bowel movements.  Take as directed and can mix with a glass of water, juice, soda, coffee, or tea.  Take if you go more than two days without a movement. Do not use MiraLax more than once per day. Call your doctor if you  are still constipated or irregular after using this medication for 7 days in a row.  If you continue to have problems with postoperative constipation, please contact the office for further assistance and recommendations.  If you experience "the worst abdominal pain ever" or develop nausea or vomiting, please contact the office immediatly for further recommendations for treatment.     Do not sit on low chairs, stoools or toilet seats, as it may be difficult to get up from low surfaces    Complete by:  As directed      Driving restrictions    Complete by:  As directed   No driving until released by the physician.     Increase activity slowly as tolerated    Complete by:  As directed      Lifting restrictions    Complete by:  As directed   No lifting until released by the physician.     Patient may shower    Complete by:  As directed  You may shower without a dressing once there is no drainage.  Do not wash over the wound.  If drainage remains, do not shower until drainage stops.     TED hose    Complete by:  As directed   Use stockings (TED hose) for 3 weeks on both leg(s).  You may remove them at night for sleeping.     Weight bearing as tolerated    Complete by:  As directed   Laterality:  right  Extremity:  Lower            Medication List    STOP taking these medications        aspirin EC 325 MG tablet     calcium-vitamin D 500-200 MG-UNIT tablet  Commonly known as:  OSCAL WITH D     leflunomide 10 MG tablet  Commonly known as:  ARAVA     leflunomide 20 MG tablet  Commonly known as:  ARAVA     omega-3 fish oil 1000 MG Caps capsule  Commonly known as:  MAXEPA      TAKE these medications        carvedilol 6.25 MG tablet  Commonly known as:  COREG  Take 6.25 mg by mouth every morning.     hydrochlorothiazide 25 MG tablet  Commonly known as:  HYDRODIURIL  Take 25 mg by mouth every morning.     lisinopril 10 MG tablet  Commonly known as:  PRINIVIL,ZESTRIL   Take 10 mg by mouth every morning.     methocarbamol 500 MG tablet  Commonly known as:  ROBAXIN  Take 1 tablet (500 mg total) by mouth every 6 (six) hours as needed for muscle spasms.     omeprazole 20 MG capsule  Commonly known as:  PRILOSEC  Take 20 mg by mouth every morning.     oxyCODONE 5 MG immediate release tablet  Commonly known as:  Oxy IR/ROXICODONE  Take 1-2 tablets (5-10 mg total) by mouth every 3 (three) hours as needed for moderate pain or severe pain.     rivaroxaban 10 MG Tabs tablet  Commonly known as:  XARELTO  Take 1 tablet (10 mg total) by mouth daily with breakfast. Take Xarelto for two and a half more weeks, then discontinue Xarelto. Once the patient has completed the Xarelto, they may resume the 325 mg Aspirin.     traMADol 50 MG tablet  Commonly known as:  ULTRAM  Take 1-2 tablets (50-100 mg total) by mouth every 6 (six) hours as needed (mild pain).           Follow-up Information    Follow up with [name], MD. Schedule an appointment as soon as possible for a visit on 04/30/2015.   Specialty:  Orthopedic Surgery   Why:  Call office at [phone] to setup appointment on Tuesday 04/30/2015 with Dr. Wynelle Link.   Contact information:   [address] [place] [address] [phone]       Signed: Arlee Muslim, PA-C Orthopaedic Surgery 04/18/2015, 7:36 AM

## 2015-04-18 NOTE — Progress Notes (Signed)
Physical Therapy Treatment Patient Details Name: [name] MRN: [id number] DOB: Feb 02, 1946 Today's Date: 04/18/2015    History of Present Illness R THR - s/p L THR and Bil TKR    PT Comments    Pt progressing well and eager for return home.  Reviewed stairs and car transfers with pt and spouse  Follow Up Recommendations  Home health PT     Equipment Recommendations  None recommended by PT    Recommendations for Other Services OT consult     Precautions / Restrictions Precautions Precautions: Fall Restrictions Weight Bearing Restrictions: No Other Position/Activity Restrictions: WBAT    Mobility  Bed Mobility Overal bed mobility: Needs Assistance Bed Mobility: Sit to Supine     Supine to sit: Min guard Sit to supine: Min guard   General bed mobility comments: cues for sequence and use of L LE to self assist  Transfers Overall transfer level: Needs assistance Equipment used: Rolling walker (2 wheeled) Transfers: Sit to/from Stand Sit to Stand: Supervision         General transfer comment: cues for LE management and use of UEs to self assist  Ambulation/Gait Ambulation/Gait assistance: Supervision Ambulation Distance (Feet): 222 Feet Assistive device: Rolling walker (2 wheeled) Gait Pattern/deviations: Step-to pattern;Step-through pattern;Shuffle;Trunk flexed Gait velocity: decr Gait velocity interpretation: Below normal speed for age/gender General Gait Details: cues for posture, position from RW and initial sequence   Stairs Stairs: Yes Stairs assistance: Min guard Stair Management: Two rails;Step to pattern;Forwards;With walker Number of Stairs: 2 General stair comments: cues for sequence and footplacement  Wheelchair Mobility    Modified Rankin (Stroke Patients Only)       Balance                                    Cognition Arousal/Alertness: Awake/alert Behavior During Therapy: WFL for tasks  assessed/performed Overall Cognitive Status: Within Functional Limits for tasks assessed                      Exercises Total Joint Exercises Ankle Circles/Pumps: AROM;Both;15 reps;Supine Quad Sets: AROM;Both;10 reps;Supine Heel Slides: AAROM;15 reps;Supine;Right Hip ABduction/ADduction: AAROM;Right;10 reps;Supine    General Comments        Pertinent Vitals/Pain Pain Assessment: 0-10 Pain Score: 3  Pain Location: R hip/thigh Pain Descriptors / Indicators: Aching;Sore Pain Intervention(s): Limited activity within patient's tolerance;Monitored during session;Premedicated before session;Ice applied    Home Living Family/patient expects to be discharged to:: Private residence Living Arrangements: Spouse/significant other Available Help at Discharge: Family Type of Home: House Home Access: Stairs to enter Entrance Stairs-Rails: Right;Left;Can reach both Home Layout: Able to live on main level with bedroom/bathroom Home Equipment: Walker - 2 wheels      Prior Function Level of Independence: Independent;Independent with assistive device(s)          PT Goals (current goals can now be found in the care plan section) Acute Rehab PT Goals Patient Stated Goal: Resume previous lifestyle with decreased pain PT Goal Formulation: With patient Time For Goal Achievement: 04/20/15 Potential to Achieve Goals: Good Progress towards PT goals: Progressing toward goals    Frequency  7X/week    PT Plan Current plan remains appropriate    Co-evaluation             End of Session Equipment Utilized During Treatment: Gait belt Activity Tolerance: Patient tolerated treatment well Patient left: in bed;with call bell/phone  within reach;with family/visitor present     Time: 1156-1212 PT Time Calculation (min) (ACUTE ONLY): 16 min  Charges:  $Gait Training: 8-22 mins $Therapeutic Exercise: 8-22 mins                    G Codes:      Shakil Dirk 05-07-2015, 12:18  PM

## 2015-04-18 NOTE — Care Management Note (Signed)
Case Management Note  Patient Details  Name: [name] MRN: [id number] Date of Birth: 1946-06-10  Subjective/Objective:                   RIGHT TOTAL HIP ARTHROPLASTY ANTERIOR APPROACH (Right) Action/Plan:   Expected Discharge Date:                  Expected Discharge Plan:  [place]  In-House Referral:     Discharge planning Services  CM Consult  Post Acute Care Choice:  Home Health Choice offered to:  Patient  DME Arranged:  N/A DME Agency:  NA  HH Arranged:  PT HH Agency:  [facility]  Status of Service:  Completed, signed off  Medicare Important Message Given:    Date Medicare IM Given:    Medicare IM give by:    Date Additional Medicare IM Given:    Additional Medicare Important Message give by:     If discussed at [place] of Stay Meetings, dates discussed:    Additional Comments: CM met with pt in room to offer choice of home health agency.  Pt chooses [facility] to render HHPT.  [facility] Go rep, Tim on unit and has spoken with pt.  No DME is needed as pt has both a rolling walker and 3n1.  No other CM needs were communicated. Dellie Catholic, RN 04/18/2015, 11:20 AM

## 2015-04-18 NOTE — Evaluation (Signed)
Occupational Therapy Evaluation Patient Details Name: [name] MRN: [id number] DOB: Nov 16, 1945 Today's Date: 04/18/2015    History of Present Illness R THR - s/p L THR and Bil TKR   Clinical Impression   Patient admitted with above. Patient independent to mod I PTA. Patient currently functioning at an overall supervision to min assist level. Pt with h/o left THR and with good memory regarding safety, ADLs, and functional mobility. D/C from acute OT services and no additional follow-up OT needs at this time. All appropriate education provided to patient and family (husband). Please re-order OT if needed.      Follow Up Recommendations  No OT follow up;Supervision - Intermittent    Equipment Recommendations  None recommended by OT    Recommendations for Other Services  None at this time    Precautions / Restrictions Precautions Precautions: Fall Restrictions Weight Bearing Restrictions: Yes Other Position/Activity Restrictions: WBAT    Mobility Bed Mobility Overal bed mobility: Modified Independent Bed Mobility: Sit to Supine Sit to supine: Min guard   General bed mobility comments: cues for sequence and use of L LE to self assist  Transfers Overall transfer level: Needs assistance Equipment used: Rolling walker (2 wheeled) Transfers: Sit to/from Stand Sit to Stand: Supervision General transfer comment: supervision for safety         ADL Overall ADL's : Needs assistance/impaired Eating/Feeding: Set up;Sitting   Grooming: Set up;Sitting   Upper Body Bathing: Set up;Sitting   Lower Body Bathing: Minimal assistance;Sit to/from stand   Upper Body Dressing : Set up;Sitting   Lower Body Dressing: Minimal assistance;Sit to/from stand   Toilet Transfer: Supervision/safety;RW;Ambulation;BSC     Toileting - Clothing Manipulation Details (indicate cue type and reason): did not occur Tub/ Banker: Walk-in shower;Ambulation;3 in 1;Rolling  walker;Supervision/safety;Cueing for safety;Cueing for sequencing   Functional mobility during ADLs: Supervision/safety General ADL Comments: Pt overall supervision with functional mobility, pt with good memory from last hip surgery    Pertinent Vitals/Pain Pain Assessment: Faces Pain Score: 3  Faces Pain Scale: Hurts little more Pain Location: right hip Pain Descriptors / Indicators: Sore;Guarding Pain Intervention(s): Monitored during session;Repositioned    Hand Dominance Right   Extremity/Trunk Assessment Upper Extremity Assessment Upper Extremity Assessment: Overall WFL for tasks assessed   Lower Extremity Assessment Lower Extremity Assessment: Defer to PT evaluation   Cervical / Trunk Assessment Cervical / Trunk Assessment: Normal   Communication Communication Communication: No difficulties   Cognition Arousal/Alertness: Awake/alert Behavior During Therapy: WFL for tasks assessed/performed Overall Cognitive Status: Within Functional Limits for tasks assessed              Home Living Family/patient expects to be discharged to:: Private residence Living Arrangements: Spouse/significant other Available Help at Discharge: Family Type of Home: House Home Access: Stairs to enter Technical brewer of Steps: 2   Home Layout: Able to live on main level with bedroom/bathroom     Bathroom Shower/Tub: Walk-in Hydrologist: Standard     Home Equipment: Environmental consultant - 2 wheels;Bedside commode    Prior Functioning/Environment Level of Independence: Independent;Independent with assistive device(s)     OT Diagnosis: Generalized weakness   OT Problem List:  n/a, no acute OT needs identified    OT Treatment/Interventions:   n/a, no acute OT needs identified    OT Goals(Current goals can be found in the care plan section) Acute Rehab OT Goals Patient Stated Goal: Resume previous lifestyle with decreased pain OT Goal Formulation: All assessment  and education complete, DC therapy  OT Frequency:   n/a, no acute OT needs identified    Barriers to D/C:  None known at this time    End of Session Equipment Utilized During Treatment: Rolling walker  Activity Tolerance: Patient tolerated treatment well Patient left: in bed;with call bell/phone within reach;with family/visitor present   Time: 0370-9643 OT Time Calculation (min): 10 min Charges:  OT General Charges $OT Visit: 1 Procedure OT Evaluation $Initial OT Evaluation Tier I: 1 Procedure  Jandel Patriarca , MS, OTR/L, CLT Pager: [phone]  04/18/2015, 1:37 PM

## 2015-04-18 NOTE — Discharge Instructions (Addendum)
° °Dr. Frank Aluisio °Total Joint Specialist °[facility] °[address] °[place], [place] [address] °[phone] ° °ANTERIOR APPROACH TOTAL HIP REPLACEMENT POSTOPERATIVE DIRECTIONS ° ° °Hip Rehabilitation, Guidelines Following Surgery  °The results of a hip operation are greatly improved after range of motion and muscle strengthening exercises. Follow all safety measures which are given to protect your hip. If any of these exercises cause increased pain or swelling in your joint, decrease the amount until you are comfortable again. Then slowly increase the exercises. Call your caregiver if you have problems or questions.  ° °HOME CARE INSTRUCTIONS  °Remove items at home which could result in a fall. This includes throw rugs or furniture in walking pathways.  °· ICE to the affected hip every three hours for 30 minutes at a time and then as needed for pain and swelling.  Continue to use ice on the hip for pain and swelling from surgery. You may notice swelling that will progress down to the foot and ankle.  This is normal after surgery.  Elevate the leg when you are not up walking on it.   °· Continue to use the breathing machine which will help keep your temperature down.  It is common for your temperature to cycle up and down following surgery, especially at night when you are not up moving around and exerting yourself.  The breathing machine keeps your lungs expanded and your temperature down. ° ° °DIET °You may resume your previous home diet once your are discharged from the hospital. ° °DRESSING / WOUND CARE / SHOWERING °You may shower 3 days after surgery, but keep the wounds dry during showering.  You may use an occlusive plastic wrap (Press'n Seal for example), NO SOAKING/SUBMERGING IN THE BATHTUB.  If the bandage gets wet, change with a clean dry gauze.  If the incision gets wet, pat the wound dry with a clean towel. °You may start showering once you are discharged home but do not  submerge the incision under water. Just pat the incision dry and apply a dry gauze dressing on daily. °Change the surgical dressing daily and reapply a dry dressing each time. ° °ACTIVITY °Walk with your walker as instructed. °Use walker as long as suggested by your caregivers. °Avoid periods of inactivity such as sitting longer than an hour when not asleep. This helps prevent blood clots.  °You may resume a sexual relationship in one month or when given the OK by your doctor.  °You may return to work once you are cleared by your doctor.  °Do not drive a car for 6 weeks or until released by you surgeon.  °Do not drive while taking narcotics. ° °WEIGHT BEARING °Weight bearing as tolerated with assist device (walker, cane, etc) as directed, use it as long as suggested by your surgeon or therapist, typically at least 4-6 weeks. ° °POSTOPERATIVE CONSTIPATION PROTOCOL °Constipation - defined medically as fewer than three stools per week and severe constipation as less than one stool per week. ° °One of the most common issues patients have following surgery is constipation.  Even if you have a regular bowel pattern at home, your normal regimen is likely to be disrupted due to multiple reasons following surgery.  Combination of anesthesia, postoperative narcotics, change in appetite and fluid intake all can affect your bowels.  In order to avoid complications following surgery, here are some recommendations in order to help you during your recovery period. ° °Colace (docusate) - Pick up an over-the-counter   form of Colace or another stool softener and take twice a day as long as you are requiring postoperative pain medications.  Take with a full glass of water daily.  If you experience loose stools or diarrhea, hold the colace until you stool forms back up.  If your symptoms do not get better within 1 week or if they get worse, check with your doctor. ° °Dulcolax (bisacodyl) - Pick up over-the-counter and take as directed  by the product packaging as needed to assist with the movement of your bowels.  Take with a full glass of water.  Use this product as needed if not relieved by Colace only.  ° °MiraLax (polyethylene glycol) - Pick up over-the-counter to have on hand.  MiraLax is a solution that will increase the amount of water in your bowels to assist with bowel movements.  Take as directed and can mix with a glass of water, juice, soda, coffee, or tea.  Take if you go more than two days without a movement. °Do not use MiraLax more than once per day. Call your doctor if you are still constipated or irregular after using this medication for 7 days in a row. ° °If you continue to have problems with postoperative constipation, please contact the office for further assistance and recommendations.  If you experience "the worst abdominal pain ever" or develop nausea or vomiting, please contact the office immediatly for further recommendations for treatment. ° °ITCHING ° If you experience itching with your medications, try taking only a single pain pill, or even half a pain pill at a time.  You can also use Benadryl over the counter for itching or also to help with sleep.  ° °TED HOSE STOCKINGS °Wear the elastic stockings on both legs for three weeks following surgery during the day but you may remove then at night for sleeping. ° °MEDICATIONS °See your medication summary on the “After Visit Summary” that the nursing staff will review with you prior to discharge.  You may have some home medications which will be placed on hold until you complete the course of blood thinner medication.  It is important for you to complete the blood thinner medication as prescribed by your surgeon.  Continue your approved medications as instructed at time of discharge. ° °PRECAUTIONS °If you experience chest pain or shortness of breath - call 911 immediately for transfer to the hospital emergency department.  °If you develop a fever greater that 101 F,  purulent drainage from wound, increased redness or drainage from wound, foul odor from the wound/dressing, or calf pain - CONTACT YOUR SURGEON.   °                                                °FOLLOW-UP APPOINTMENTS °Make sure you keep all of your appointments after your operation with your surgeon and caregivers. You should call the office at the above phone number and make an appointment for approximately two weeks after the date of your surgery or on the date instructed by your surgeon outlined in the "After Visit Summary". ° °RANGE OF MOTION AND STRENGTHENING EXERCISES  °These exercises are designed to help you keep full movement of your hip joint. Follow your caregiver's or physical therapist's instructions. Perform all exercises about fifteen times, three times per day or as directed. Exercise both hips, even if you   have had only one joint replacement. These exercises can be done on a training (exercise) mat, on the floor, on a table or on a bed. Use whatever works the best and is most comfortable for you. Use music or television while you are exercising so that the exercises are a pleasant break in your day. This will make your life better with the exercises acting as a break in routine you can look forward to.  Lying on your back, slowly slide your foot toward your buttocks, raising your knee up off the floor. Then slowly slide your foot back down until your leg is straight again.  Lying on your back spread your legs as far apart as you can without causing discomfort.  Lying on your side, raise your upper leg and foot straight up from the floor as far as is comfortable. Slowly lower the leg and repeat.  Lying on your back, tighten up the muscle in the front of your thigh (quadriceps muscles). You can do this by keeping your leg straight and trying to raise your heel off the floor. This helps strengthen the largest muscle supporting your knee.  Lying on your back, tighten up the muscles of your  buttocks both with the legs straight and with the knee bent at a comfortable angle while keeping your heel on the floor.   IF YOU ARE TRANSFERRED TO A SKILLED REHAB FACILITY If the patient is transferred to a skilled rehab facility following release from the hospital, a list of the current medications will be sent to the facility for the patient to continue.  When discharged from the skilled rehab facility, please have the facility set up the patient's [place] prior to being released. Also, the skilled facility will be responsible for providing the patient with their medications at time of release from the facility to include their pain medication, the muscle relaxants, and their blood thinner medication. If the patient is still at the rehab facility at time of the two week follow up appointment, the skilled rehab facility will also need to assist the patient in arranging follow up appointment in our office and any transportation needs.  MAKE SURE YOU:  Understand these instructions.  Get help right away if you are not doing well or get worse.    Pick up stool softner and laxative for home use following surgery while on pain medications. Do not submerge incision under water. Please use good hand washing techniques while changing dressing each day. May shower starting three days after surgery. Please use a clean towel to pat the incision dry following showers. Continue to use ice for pain and swelling after surgery. Do not use any lotions or creams on the incision until instructed by your surgeon.  Take Xarelto for two and a half more weeks, then discontinue Xarelto. Once the patient has completed the Xarelto, they may resume the 325 mg Aspirin.  Information on my medicine - XARELTO (Rivaroxaban)  This medication education was reviewed with me or my healthcare representative as part of my discharge preparation.  The pharmacist that spoke with me during my hospital stay  was:  Clovis Riley, [facility]  Why was Xarelto prescribed for you? Xarelto was prescribed for you to reduce the risk of blood clots forming after orthopedic surgery. The medical term for these abnormal blood clots is venous thromboembolism (VTE).  What do you need to know about xarelto ? Take your Xarelto ONCE DAILY at the same time every day.  You may take it either with or without food.  If you have difficulty swallowing the tablet whole, you may crush it and mix in applesauce just prior to taking your dose.  Take Xarelto exactly as prescribed by your doctor and DO NOT stop taking Xarelto without talking to the doctor who prescribed the medication.  Stopping without other VTE prevention medication to take the place of Xarelto may increase your risk of developing a clot.  After discharge, you should have regular check-up appointments with your healthcare provider that is prescribing your Xarelto.    What do you do if you miss a dose? If you miss a dose, take it as soon as you remember on the same day then continue your regularly scheduled once daily regimen the next day. Do not take two doses of Xarelto on the same day.   Important Safety Information A possible side effect of Xarelto is bleeding. You should call your healthcare provider right away if you experience any of the following: ? Bleeding from an injury or your nose that does not stop. ? Unusual colored urine (red or dark brown) or unusual colored stools (red or black). ? Unusual bruising for unknown reasons. ? A serious fall or if you hit your head (even if there is no bleeding).  Some medicines may interact with Xarelto and might increase your risk of bleeding while on Xarelto. To help avoid this, consult your healthcare provider or pharmacist prior to using any new prescription or non-prescription medications, including herbals, vitamins, non-steroidal anti-inflammatory drugs (NSAIDs) and supplements.  This  website has more information on Xarelto: [email or web address]

## 2015-04-18 NOTE — Progress Notes (Signed)
Physical Therapy Evaluation Patient Details Name: [name] MRN: [id number] DOB: 1945/09/25 Today's Date: 04/18/2015   History of Present Illness  R THR - s/p L THR and Bil TKR  Clinical Impression  Pt s/p R THR presents with decreased R LE strength/ROM and post op pain limiting functional mobility.  Pt should progress well to dc home with family assist and HHPT follow up.    Follow Up Recommendations Home health PT    Equipment Recommendations  None recommended by PT    Recommendations for Other Services OT consult     Precautions / Restrictions Precautions Precautions: Fall Restrictions Weight Bearing Restrictions: No Other Position/Activity Restrictions: WBAT      Mobility  Bed Mobility Overal bed mobility: Needs Assistance Bed Mobility: Supine to Sit     Supine to sit: Min guard     General bed mobility comments: cues for sequence and use of L LE to self assist  Transfers Overall transfer level: Needs assistance Equipment used: Rolling walker (2 wheeled) Transfers: Sit to/from Stand Sit to Stand: Min guard         General transfer comment: cues for LE management and use of UEs to self assist  Ambulation/Gait Ambulation/Gait assistance: Min assist;Min guard Ambulation Distance (Feet): 159 Feet Assistive device: Rolling walker (2 wheeled) Gait Pattern/deviations: Step-to pattern;Step-through pattern;Decreased step length - right;Decreased step length - left;Shuffle;Trunk flexed Gait velocity: decr Gait velocity interpretation: Below normal speed for age/gender General Gait Details: cues for posture, position from RW and initial sequence  Stairs            Wheelchair Mobility    Modified Rankin (Stroke Patients Only)       Balance                                             Pertinent Vitals/Pain Pain Assessment: 0-10 Pain Score: 4  Pain Location: R hip/thigh Pain Descriptors / Indicators: Aching;Burning Pain  Intervention(s): Limited activity within patient's tolerance;Monitored during session;Premedicated before session;Ice applied    Home Living Family/patient expects to be discharged to:: Private residence Living Arrangements: Spouse/significant other Available Help at Discharge: Family Type of Home: House Home Access: Stairs to enter Entrance Stairs-Rails: Right;Left;Can reach both Entrance Stairs-Number of Steps: 2 Home Layout: Able to live on main level with bedroom/bathroom Home Equipment: Walker - 2 wheels      Prior Function Level of Independence: Independent;Independent with assistive device(s)               Hand Dominance   Dominant Hand: Right    Extremity/Trunk Assessment   Upper Extremity Assessment: Overall WFL for tasks assessed           Lower Extremity Assessment: RLE deficits/detail RLE Deficits / Details: 2+/5 strength at hip with AAROM at hip to 90 flex and 20 abd    Cervical / Trunk Assessment: Normal  Communication   Communication: No difficulties  Cognition Arousal/Alertness: Awake/alert Behavior During Therapy: WFL for tasks assessed/performed Overall Cognitive Status: Within Functional Limits for tasks assessed                      General Comments      Exercises Total Joint Exercises Ankle Circles/Pumps: AROM;Both;15 reps;Supine Quad Sets: AROM;Both;10 reps;Supine Heel Slides: AAROM;15 reps;Supine;Right Hip ABduction/ADduction: AAROM;Right;10 reps;Supine      Assessment/Plan    PT Assessment  Patient needs continued PT services  PT Diagnosis Difficulty walking   PT Problem List Decreased strength;Decreased range of motion;Decreased activity tolerance;Decreased mobility;Decreased knowledge of use of DME;Pain  PT Treatment Interventions DME instruction;Gait training;Stair training;Functional mobility training;Therapeutic activities;Therapeutic exercise;Patient/family education   PT Goals (Current goals can be found in the  Care Plan section) Acute Rehab PT Goals Patient Stated Goal: Resume previous lifestyle with decreased pain PT Goal Formulation: With patient Time For Goal Achievement: 04/20/15 Potential to Achieve Goals: Good    Frequency 7X/week   Barriers to discharge        Co-evaluation               End of Session Equipment Utilized During Treatment: Gait belt Activity Tolerance: Patient tolerated treatment well Patient left: in chair;with call bell/phone within reach;with family/visitor present Nurse Communication: Mobility status         Time: 9470-7615 PT Time Calculation (min) (ACUTE ONLY): 23 min   Charges:   PT Evaluation $Initial PT Evaluation Tier I: 1 Procedure PT Treatments $Therapeutic Exercise: 8-22 mins   PT G Codes:        Landon Bassford 2015-05-08, 11:30 AM

## 2015-04-18 NOTE — Progress Notes (Signed)
   Subjective: 1 Day Post-Op Procedure(s) (LRB): RIGHT TOTAL HIP ARTHROPLASTY ANTERIOR APPROACH (Right) Patient reports pain as mild.   Patient seen in rounds by Dr. Wynelle Link. Patient is well, but has had some minor complaints of pain in the hip, requiring pain medications We will start therapy today.   She wants to try and go home alter this afternoon so will make arrangements for release for later today if she meets all goals and doing well Plan is to go Home after hospital stay.  Will see how she does today.  Objective: Vital signs in last 24 hours: Temp:  [97.4 F (36.3 C)-98.5 F (36.9 C)] 98.1 F (36.7 C) (10/06 0158) Pulse Rate:  [64-85] 82 (10/06 0158) Resp:  [12-18] 16 (10/06 0158) BP: (130-155)/(60-86) 130/60 mmHg (10/06 0158) SpO2:  [98 %-100 %] 98 % (10/06 0158) Weight:  [69.854 kg (154 lb)] 69.854 kg (154 lb) (10/05 1752)  Intake/Output from previous day:  Intake/Output Summary (Last 24 hours) at 04/18/15 0714 Last data filed at 04/18/15 0158  Gross per 24 hour  Intake   2780 ml  Output   1275 ml  Net   1505 ml    Intake/Output this shift: UOP about 500 since around MN  Labs:  Recent Labs  04/17/15 1205 04/18/15 0445  HGB 12.1 10.4*    Recent Labs  04/17/15 1205 04/18/15 0445  WBC 5.3 6.8  RBC 4.35 3.69*  HCT 37.3 31.7*  PLT 167 149*    Recent Labs  04/17/15 1205 04/18/15 0445  NA 142 137  K 3.8 3.7  CL 106 106  CO2 27 23  BUN 9 10  CREATININE 0.65 0.73  GLUCOSE 104* 198*  CALCIUM 8.9 8.4*   No results for input(s): LABPT, INR in the last 72 hours.  EXAM General - Patient is Alert, Appropriate and Oriented Extremity - Neurovascular intact Sensation intact distally Dorsiflexion/Plantar flexion intact Dressing - dressing C/D/I Motor Function - intact, moving foot and toes well on exam.  Hemovac pulled without difficulty.  Past Medical History  Diagnosis Date  . Hypertension   . Stroke [facility])     hx of mini strokes - 8 years ago  - no deficits   . GERD (gastroesophageal reflux disease)   . History of hiatal hernia   . Arthritis     rheumatoid arthritis     Assessment/Plan: 1 Day Post-Op Procedure(s) (LRB): RIGHT TOTAL HIP ARTHROPLASTY ANTERIOR APPROACH (Right) Principal Problem:   OA (osteoarthritis) of hip  Estimated body mass index is 26.85 kg/(m^2) as calculated from the following:   Height as of this encounter: 5' 3.5" (1.613 m).   Weight as of this encounter: 69.854 kg (154 lb). Advance diet Up with therapy Discharge home with home health if she does well today with therapy.  DVT Prophylaxis - Xarelto Weight Bearing As Tolerated right Leg Hemovac Pulled Begin Therapy  If does well and ready to go home: Diet - Cardiac diet Follow up - in 2 weeks Activity - WBAT Disposition - Home Condition Upon Discharge - Pending upon results today D/C Meds - See DC Summary DVT Prophylaxis - Xarelto  Arlee Muslim, PA-C Orthopaedic Surgery 04/18/2015, 7:14 AM

## 2015-04-18 NOTE — Plan of Care (Signed)
Problem: Consults Goal: Diagnosis- Total Joint Replacement Outcome: Completed/Met Date Met:  04/18/15 Primary Total Hip RIGHT, Anterior

## 2015-08-15 DIAGNOSIS — Z1231 Encounter for screening mammogram for malignant neoplasm of breast: Secondary | ICD-10-CM | POA: Diagnosis not present

## 2015-08-26 DIAGNOSIS — M0589 Other rheumatoid arthritis with rheumatoid factor of multiple sites: Secondary | ICD-10-CM | POA: Diagnosis not present

## 2015-08-26 DIAGNOSIS — M150 Primary generalized (osteo)arthritis: Secondary | ICD-10-CM | POA: Diagnosis not present

## 2015-08-26 DIAGNOSIS — M5136 Other intervertebral disc degeneration, lumbar region: Secondary | ICD-10-CM | POA: Diagnosis not present

## 2015-09-25 DIAGNOSIS — S39012A Strain of muscle, fascia and tendon of lower back, initial encounter: Secondary | ICD-10-CM | POA: Diagnosis not present

## 2015-09-25 DIAGNOSIS — Z79899 Other long term (current) drug therapy: Secondary | ICD-10-CM | POA: Diagnosis not present

## 2015-09-25 DIAGNOSIS — K219 Gastro-esophageal reflux disease without esophagitis: Secondary | ICD-10-CM | POA: Diagnosis not present

## 2015-09-25 DIAGNOSIS — E782 Mixed hyperlipidemia: Secondary | ICD-10-CM | POA: Diagnosis not present

## 2015-09-25 DIAGNOSIS — M069 Rheumatoid arthritis, unspecified: Secondary | ICD-10-CM | POA: Diagnosis not present

## 2015-09-25 DIAGNOSIS — F339 Major depressive disorder, recurrent, unspecified: Secondary | ICD-10-CM | POA: Diagnosis not present

## 2015-09-25 DIAGNOSIS — I10 Essential (primary) hypertension: Secondary | ICD-10-CM | POA: Diagnosis not present

## 2015-09-25 DIAGNOSIS — L84 Corns and callosities: Secondary | ICD-10-CM | POA: Diagnosis not present

## 2015-10-02 DIAGNOSIS — H52223 Regular astigmatism, bilateral: Secondary | ICD-10-CM | POA: Diagnosis not present

## 2015-10-02 DIAGNOSIS — H2513 Age-related nuclear cataract, bilateral: Secondary | ICD-10-CM | POA: Diagnosis not present

## 2015-10-02 DIAGNOSIS — H40053 Ocular hypertension, bilateral: Secondary | ICD-10-CM | POA: Diagnosis not present

## 2015-11-28 DIAGNOSIS — M0589 Other rheumatoid arthritis with rheumatoid factor of multiple sites: Secondary | ICD-10-CM | POA: Diagnosis not present

## 2015-11-28 DIAGNOSIS — M79673 Pain in unspecified foot: Secondary | ICD-10-CM | POA: Diagnosis not present

## 2015-11-28 DIAGNOSIS — M5136 Other intervertebral disc degeneration, lumbar region: Secondary | ICD-10-CM | POA: Diagnosis not present

## 2015-11-28 DIAGNOSIS — M150 Primary generalized (osteo)arthritis: Secondary | ICD-10-CM | POA: Diagnosis not present

## 2016-02-27 DIAGNOSIS — M5136 Other intervertebral disc degeneration, lumbar region: Secondary | ICD-10-CM | POA: Diagnosis not present

## 2016-02-27 DIAGNOSIS — M0589 Other rheumatoid arthritis with rheumatoid factor of multiple sites: Secondary | ICD-10-CM | POA: Diagnosis not present

## 2016-02-27 DIAGNOSIS — M79673 Pain in unspecified foot: Secondary | ICD-10-CM | POA: Diagnosis not present

## 2016-02-27 DIAGNOSIS — Z79899 Other long term (current) drug therapy: Secondary | ICD-10-CM | POA: Diagnosis not present

## 2016-02-27 DIAGNOSIS — M150 Primary generalized (osteo)arthritis: Secondary | ICD-10-CM | POA: Diagnosis not present

## 2016-04-10 DIAGNOSIS — Z79899 Other long term (current) drug therapy: Secondary | ICD-10-CM | POA: Diagnosis not present

## 2016-04-10 DIAGNOSIS — I10 Essential (primary) hypertension: Secondary | ICD-10-CM | POA: Diagnosis not present

## 2016-04-10 DIAGNOSIS — E538 Deficiency of other specified B group vitamins: Secondary | ICD-10-CM | POA: Diagnosis not present

## 2016-04-10 DIAGNOSIS — K219 Gastro-esophageal reflux disease without esophagitis: Secondary | ICD-10-CM | POA: Diagnosis not present

## 2016-04-10 DIAGNOSIS — M160 Bilateral primary osteoarthritis of hip: Secondary | ICD-10-CM | POA: Diagnosis not present

## 2016-04-10 DIAGNOSIS — R5381 Other malaise: Secondary | ICD-10-CM | POA: Diagnosis not present

## 2016-04-10 DIAGNOSIS — F5102 Adjustment insomnia: Secondary | ICD-10-CM | POA: Diagnosis not present

## 2016-04-10 DIAGNOSIS — M069 Rheumatoid arthritis, unspecified: Secondary | ICD-10-CM | POA: Diagnosis not present

## 2016-04-10 DIAGNOSIS — Z23 Encounter for immunization: Secondary | ICD-10-CM | POA: Diagnosis not present

## 2016-04-10 DIAGNOSIS — G2581 Restless legs syndrome: Secondary | ICD-10-CM | POA: Diagnosis not present

## 2016-04-10 DIAGNOSIS — E782 Mixed hyperlipidemia: Secondary | ICD-10-CM | POA: Diagnosis not present

## 2016-04-10 DIAGNOSIS — F339 Major depressive disorder, recurrent, unspecified: Secondary | ICD-10-CM | POA: Diagnosis not present

## 2016-04-10 DIAGNOSIS — Z0000 Encounter for general adult medical examination without abnormal findings: Secondary | ICD-10-CM | POA: Diagnosis not present

## 2016-04-29 DIAGNOSIS — M0589 Other rheumatoid arthritis with rheumatoid factor of multiple sites: Secondary | ICD-10-CM | POA: Diagnosis not present

## 2016-04-29 DIAGNOSIS — M150 Primary generalized (osteo)arthritis: Secondary | ICD-10-CM | POA: Diagnosis not present

## 2016-04-29 DIAGNOSIS — M5136 Other intervertebral disc degeneration, lumbar region: Secondary | ICD-10-CM | POA: Diagnosis not present

## 2016-04-29 DIAGNOSIS — M79673 Pain in unspecified foot: Secondary | ICD-10-CM | POA: Diagnosis not present

## 2016-04-29 DIAGNOSIS — Z79899 Other long term (current) drug therapy: Secondary | ICD-10-CM | POA: Diagnosis not present

## 2016-05-28 DIAGNOSIS — M0589 Other rheumatoid arthritis with rheumatoid factor of multiple sites: Secondary | ICD-10-CM | POA: Diagnosis not present

## 2016-06-26 DIAGNOSIS — Z96641 Presence of right artificial hip joint: Secondary | ICD-10-CM | POA: Diagnosis not present

## 2016-06-26 DIAGNOSIS — Z471 Aftercare following joint replacement surgery: Secondary | ICD-10-CM | POA: Diagnosis not present

## 2016-06-30 DIAGNOSIS — Z6830 Body mass index (BMI) 30.0-30.9, adult: Secondary | ICD-10-CM | POA: Diagnosis not present

## 2016-06-30 DIAGNOSIS — M5136 Other intervertebral disc degeneration, lumbar region: Secondary | ICD-10-CM | POA: Diagnosis not present

## 2016-06-30 DIAGNOSIS — Z79899 Other long term (current) drug therapy: Secondary | ICD-10-CM | POA: Diagnosis not present

## 2016-06-30 DIAGNOSIS — E669 Obesity, unspecified: Secondary | ICD-10-CM | POA: Diagnosis not present

## 2016-06-30 DIAGNOSIS — M150 Primary generalized (osteo)arthritis: Secondary | ICD-10-CM | POA: Diagnosis not present

## 2016-06-30 DIAGNOSIS — M79673 Pain in unspecified foot: Secondary | ICD-10-CM | POA: Diagnosis not present

## 2016-06-30 DIAGNOSIS — M0589 Other rheumatoid arthritis with rheumatoid factor of multiple sites: Secondary | ICD-10-CM | POA: Diagnosis not present

## 2016-08-06 DIAGNOSIS — L821 Other seborrheic keratosis: Secondary | ICD-10-CM | POA: Diagnosis not present

## 2016-08-06 DIAGNOSIS — L299 Pruritus, unspecified: Secondary | ICD-10-CM | POA: Diagnosis not present

## 2016-09-03 DIAGNOSIS — J0100 Acute maxillary sinusitis, unspecified: Secondary | ICD-10-CM | POA: Diagnosis not present

## 2016-09-03 DIAGNOSIS — M069 Rheumatoid arthritis, unspecified: Secondary | ICD-10-CM | POA: Diagnosis not present

## 2016-09-03 DIAGNOSIS — Z79899 Other long term (current) drug therapy: Secondary | ICD-10-CM | POA: Diagnosis not present

## 2016-09-03 DIAGNOSIS — R231 Pallor: Secondary | ICD-10-CM | POA: Diagnosis not present

## 2016-09-03 DIAGNOSIS — R079 Chest pain, unspecified: Secondary | ICD-10-CM | POA: Diagnosis not present

## 2016-09-05 DIAGNOSIS — R1013 Epigastric pain: Secondary | ICD-10-CM | POA: Diagnosis not present

## 2016-09-05 DIAGNOSIS — R109 Unspecified abdominal pain: Secondary | ICD-10-CM | POA: Diagnosis not present

## 2016-09-05 DIAGNOSIS — R079 Chest pain, unspecified: Secondary | ICD-10-CM | POA: Diagnosis not present

## 2016-09-07 DIAGNOSIS — Z1231 Encounter for screening mammogram for malignant neoplasm of breast: Secondary | ICD-10-CM | POA: Diagnosis not present

## 2016-09-08 DIAGNOSIS — R9431 Abnormal electrocardiogram [ECG] [EKG]: Secondary | ICD-10-CM | POA: Diagnosis not present

## 2016-09-15 DIAGNOSIS — Z79899 Other long term (current) drug therapy: Secondary | ICD-10-CM | POA: Diagnosis not present

## 2016-09-15 DIAGNOSIS — Z6830 Body mass index (BMI) 30.0-30.9, adult: Secondary | ICD-10-CM | POA: Diagnosis not present

## 2016-09-15 DIAGNOSIS — M150 Primary generalized (osteo)arthritis: Secondary | ICD-10-CM | POA: Diagnosis not present

## 2016-09-15 DIAGNOSIS — M7989 Other specified soft tissue disorders: Secondary | ICD-10-CM | POA: Diagnosis not present

## 2016-09-15 DIAGNOSIS — M0589 Other rheumatoid arthritis with rheumatoid factor of multiple sites: Secondary | ICD-10-CM | POA: Diagnosis not present

## 2016-09-15 DIAGNOSIS — E669 Obesity, unspecified: Secondary | ICD-10-CM | POA: Diagnosis not present

## 2016-09-15 DIAGNOSIS — M5136 Other intervertebral disc degeneration, lumbar region: Secondary | ICD-10-CM | POA: Diagnosis not present

## 2016-10-15 DIAGNOSIS — E538 Deficiency of other specified B group vitamins: Secondary | ICD-10-CM | POA: Diagnosis not present

## 2016-10-15 DIAGNOSIS — K219 Gastro-esophageal reflux disease without esophagitis: Secondary | ICD-10-CM | POA: Diagnosis not present

## 2016-10-15 DIAGNOSIS — I10 Essential (primary) hypertension: Secondary | ICD-10-CM | POA: Diagnosis not present

## 2016-10-19 DIAGNOSIS — R079 Chest pain, unspecified: Secondary | ICD-10-CM | POA: Diagnosis not present

## 2016-10-19 DIAGNOSIS — R9431 Abnormal electrocardiogram [ECG] [EKG]: Secondary | ICD-10-CM | POA: Diagnosis not present

## 2016-10-19 DIAGNOSIS — I10 Essential (primary) hypertension: Secondary | ICD-10-CM | POA: Diagnosis not present

## 2016-10-20 DIAGNOSIS — D485 Neoplasm of uncertain behavior of skin: Secondary | ICD-10-CM | POA: Diagnosis not present

## 2016-11-03 DIAGNOSIS — R9431 Abnormal electrocardiogram [ECG] [EKG]: Secondary | ICD-10-CM | POA: Diagnosis not present

## 2016-11-03 DIAGNOSIS — R079 Chest pain, unspecified: Secondary | ICD-10-CM | POA: Diagnosis not present

## 2016-11-05 DIAGNOSIS — R9431 Abnormal electrocardiogram [ECG] [EKG]: Secondary | ICD-10-CM | POA: Diagnosis not present

## 2016-11-05 DIAGNOSIS — R9439 Abnormal result of other cardiovascular function study: Secondary | ICD-10-CM | POA: Diagnosis not present

## 2016-11-05 DIAGNOSIS — R079 Chest pain, unspecified: Secondary | ICD-10-CM | POA: Diagnosis not present

## 2016-11-09 DIAGNOSIS — R0789 Other chest pain: Secondary | ICD-10-CM | POA: Diagnosis not present

## 2016-11-09 DIAGNOSIS — R9430 Abnormal result of cardiovascular function study, unspecified: Secondary | ICD-10-CM | POA: Diagnosis not present

## 2016-11-09 DIAGNOSIS — I252 Old myocardial infarction: Secondary | ICD-10-CM | POA: Diagnosis not present

## 2016-11-09 DIAGNOSIS — Z7982 Long term (current) use of aspirin: Secondary | ICD-10-CM | POA: Diagnosis not present

## 2016-11-09 DIAGNOSIS — I10 Essential (primary) hypertension: Secondary | ICD-10-CM | POA: Diagnosis not present

## 2016-11-09 DIAGNOSIS — R9431 Abnormal electrocardiogram [ECG] [EKG]: Secondary | ICD-10-CM | POA: Diagnosis not present

## 2016-11-09 DIAGNOSIS — M069 Rheumatoid arthritis, unspecified: Secondary | ICD-10-CM | POA: Diagnosis not present

## 2016-11-09 DIAGNOSIS — R079 Chest pain, unspecified: Secondary | ICD-10-CM | POA: Diagnosis not present

## 2016-11-09 DIAGNOSIS — Z880 Allergy status to penicillin: Secondary | ICD-10-CM | POA: Diagnosis not present

## 2016-11-09 DIAGNOSIS — Z881 Allergy status to other antibiotic agents status: Secondary | ICD-10-CM | POA: Diagnosis not present

## 2016-11-09 DIAGNOSIS — E785 Hyperlipidemia, unspecified: Secondary | ICD-10-CM | POA: Diagnosis not present

## 2016-11-09 DIAGNOSIS — R0602 Shortness of breath: Secondary | ICD-10-CM | POA: Diagnosis not present

## 2016-11-09 DIAGNOSIS — Z79899 Other long term (current) drug therapy: Secondary | ICD-10-CM | POA: Diagnosis not present

## 2016-12-15 DIAGNOSIS — R9439 Abnormal result of other cardiovascular function study: Secondary | ICD-10-CM | POA: Diagnosis not present

## 2016-12-15 DIAGNOSIS — R079 Chest pain, unspecified: Secondary | ICD-10-CM | POA: Diagnosis not present

## 2016-12-15 DIAGNOSIS — I779 Disorder of arteries and arterioles, unspecified: Secondary | ICD-10-CM | POA: Diagnosis not present

## 2016-12-15 DIAGNOSIS — I10 Essential (primary) hypertension: Secondary | ICD-10-CM | POA: Diagnosis not present

## 2016-12-16 DIAGNOSIS — Z79899 Other long term (current) drug therapy: Secondary | ICD-10-CM | POA: Diagnosis not present

## 2016-12-16 DIAGNOSIS — Z6830 Body mass index (BMI) 30.0-30.9, adult: Secondary | ICD-10-CM | POA: Diagnosis not present

## 2016-12-16 DIAGNOSIS — M150 Primary generalized (osteo)arthritis: Secondary | ICD-10-CM | POA: Diagnosis not present

## 2016-12-16 DIAGNOSIS — M5136 Other intervertebral disc degeneration, lumbar region: Secondary | ICD-10-CM | POA: Diagnosis not present

## 2016-12-16 DIAGNOSIS — M0589 Other rheumatoid arthritis with rheumatoid factor of multiple sites: Secondary | ICD-10-CM | POA: Diagnosis not present

## 2016-12-23 DIAGNOSIS — I779 Disorder of arteries and arterioles, unspecified: Secondary | ICD-10-CM | POA: Diagnosis not present

## 2017-03-18 DIAGNOSIS — Z719 Counseling, unspecified: Secondary | ICD-10-CM | POA: Diagnosis not present

## 2017-03-18 DIAGNOSIS — Z79899 Other long term (current) drug therapy: Secondary | ICD-10-CM | POA: Diagnosis not present

## 2017-03-18 DIAGNOSIS — M5136 Other intervertebral disc degeneration, lumbar region: Secondary | ICD-10-CM | POA: Diagnosis not present

## 2017-03-18 DIAGNOSIS — G629 Polyneuropathy, unspecified: Secondary | ICD-10-CM | POA: Diagnosis not present

## 2017-03-18 DIAGNOSIS — Z6830 Body mass index (BMI) 30.0-30.9, adult: Secondary | ICD-10-CM | POA: Diagnosis not present

## 2017-03-18 DIAGNOSIS — M150 Primary generalized (osteo)arthritis: Secondary | ICD-10-CM | POA: Diagnosis not present

## 2017-03-18 DIAGNOSIS — M0589 Other rheumatoid arthritis with rheumatoid factor of multiple sites: Secondary | ICD-10-CM | POA: Diagnosis not present

## 2017-05-18 DIAGNOSIS — Z79899 Other long term (current) drug therapy: Secondary | ICD-10-CM | POA: Diagnosis not present

## 2017-05-18 DIAGNOSIS — Z23 Encounter for immunization: Secondary | ICD-10-CM | POA: Diagnosis not present

## 2017-05-18 DIAGNOSIS — E669 Obesity, unspecified: Secondary | ICD-10-CM | POA: Diagnosis not present

## 2017-05-18 DIAGNOSIS — M0589 Other rheumatoid arthritis with rheumatoid factor of multiple sites: Secondary | ICD-10-CM | POA: Diagnosis not present

## 2017-05-18 DIAGNOSIS — M150 Primary generalized (osteo)arthritis: Secondary | ICD-10-CM | POA: Diagnosis not present

## 2017-05-18 DIAGNOSIS — G629 Polyneuropathy, unspecified: Secondary | ICD-10-CM | POA: Diagnosis not present

## 2017-05-18 DIAGNOSIS — Z6831 Body mass index (BMI) 31.0-31.9, adult: Secondary | ICD-10-CM | POA: Diagnosis not present

## 2017-05-18 DIAGNOSIS — M5136 Other intervertebral disc degeneration, lumbar region: Secondary | ICD-10-CM | POA: Diagnosis not present

## 2017-05-18 DIAGNOSIS — Z719 Counseling, unspecified: Secondary | ICD-10-CM | POA: Diagnosis not present

## 2017-05-31 DIAGNOSIS — R5383 Other fatigue: Secondary | ICD-10-CM | POA: Diagnosis not present

## 2017-05-31 DIAGNOSIS — K5750 Diverticulosis of both small and large intestine without perforation or abscess without bleeding: Secondary | ICD-10-CM | POA: Diagnosis not present

## 2017-05-31 DIAGNOSIS — Z0000 Encounter for general adult medical examination without abnormal findings: Secondary | ICD-10-CM | POA: Diagnosis not present

## 2017-05-31 DIAGNOSIS — G2581 Restless legs syndrome: Secondary | ICD-10-CM | POA: Diagnosis not present

## 2017-05-31 DIAGNOSIS — K219 Gastro-esophageal reflux disease without esophagitis: Secondary | ICD-10-CM | POA: Diagnosis not present

## 2017-05-31 DIAGNOSIS — E782 Mixed hyperlipidemia: Secondary | ICD-10-CM | POA: Diagnosis not present

## 2017-05-31 DIAGNOSIS — R5381 Other malaise: Secondary | ICD-10-CM | POA: Diagnosis not present

## 2017-05-31 DIAGNOSIS — Z23 Encounter for immunization: Secondary | ICD-10-CM | POA: Diagnosis not present

## 2017-05-31 DIAGNOSIS — I10 Essential (primary) hypertension: Secondary | ICD-10-CM | POA: Diagnosis not present

## 2017-05-31 DIAGNOSIS — M069 Rheumatoid arthritis, unspecified: Secondary | ICD-10-CM | POA: Diagnosis not present

## 2017-07-19 DIAGNOSIS — M069 Rheumatoid arthritis, unspecified: Secondary | ICD-10-CM | POA: Diagnosis not present

## 2017-07-19 DIAGNOSIS — I10 Essential (primary) hypertension: Secondary | ICD-10-CM | POA: Diagnosis not present

## 2017-07-19 DIAGNOSIS — N3001 Acute cystitis with hematuria: Secondary | ICD-10-CM | POA: Diagnosis not present

## 2017-07-19 DIAGNOSIS — M19019 Primary osteoarthritis, unspecified shoulder: Secondary | ICD-10-CM | POA: Diagnosis not present

## 2017-08-06 DIAGNOSIS — Z79899 Other long term (current) drug therapy: Secondary | ICD-10-CM | POA: Diagnosis not present

## 2017-08-06 DIAGNOSIS — N390 Urinary tract infection, site not specified: Secondary | ICD-10-CM | POA: Diagnosis not present

## 2017-08-06 DIAGNOSIS — M069 Rheumatoid arthritis, unspecified: Secondary | ICD-10-CM | POA: Diagnosis not present

## 2017-08-06 DIAGNOSIS — E538 Deficiency of other specified B group vitamins: Secondary | ICD-10-CM | POA: Diagnosis not present

## 2017-08-06 DIAGNOSIS — E782 Mixed hyperlipidemia: Secondary | ICD-10-CM | POA: Diagnosis not present

## 2017-08-06 DIAGNOSIS — D509 Iron deficiency anemia, unspecified: Secondary | ICD-10-CM | POA: Diagnosis not present

## 2017-08-13 DIAGNOSIS — I700 Atherosclerosis of aorta: Secondary | ICD-10-CM | POA: Diagnosis not present

## 2017-08-13 DIAGNOSIS — K7689 Other specified diseases of liver: Secondary | ICD-10-CM | POA: Diagnosis not present

## 2017-08-13 DIAGNOSIS — D509 Iron deficiency anemia, unspecified: Secondary | ICD-10-CM | POA: Diagnosis not present

## 2017-08-13 DIAGNOSIS — R109 Unspecified abdominal pain: Secondary | ICD-10-CM | POA: Diagnosis not present

## 2017-08-13 DIAGNOSIS — K5790 Diverticulosis of intestine, part unspecified, without perforation or abscess without bleeding: Secondary | ICD-10-CM | POA: Diagnosis not present

## 2017-08-13 DIAGNOSIS — N281 Cyst of kidney, acquired: Secondary | ICD-10-CM | POA: Diagnosis not present

## 2017-08-18 DIAGNOSIS — Z6830 Body mass index (BMI) 30.0-30.9, adult: Secondary | ICD-10-CM | POA: Diagnosis not present

## 2017-08-18 DIAGNOSIS — M5136 Other intervertebral disc degeneration, lumbar region: Secondary | ICD-10-CM | POA: Diagnosis not present

## 2017-08-18 DIAGNOSIS — E669 Obesity, unspecified: Secondary | ICD-10-CM | POA: Diagnosis not present

## 2017-08-18 DIAGNOSIS — Z79899 Other long term (current) drug therapy: Secondary | ICD-10-CM | POA: Diagnosis not present

## 2017-08-18 DIAGNOSIS — M150 Primary generalized (osteo)arthritis: Secondary | ICD-10-CM | POA: Diagnosis not present

## 2017-08-18 DIAGNOSIS — Z719 Counseling, unspecified: Secondary | ICD-10-CM | POA: Diagnosis not present

## 2017-08-18 DIAGNOSIS — M0589 Other rheumatoid arthritis with rheumatoid factor of multiple sites: Secondary | ICD-10-CM | POA: Diagnosis not present

## 2017-08-18 DIAGNOSIS — G629 Polyneuropathy, unspecified: Secondary | ICD-10-CM | POA: Diagnosis not present

## 2017-09-01 DIAGNOSIS — K5730 Diverticulosis of large intestine without perforation or abscess without bleeding: Secondary | ICD-10-CM | POA: Diagnosis not present

## 2017-09-01 DIAGNOSIS — D500 Iron deficiency anemia secondary to blood loss (chronic): Secondary | ICD-10-CM | POA: Diagnosis not present

## 2017-09-01 IMAGING — DX DG PORTABLE PELVIS
1 series · 1 of 1 positions shown · non-contrast
Comparison: Abdominal and pelvic CT scan dated February 18, 2015

CLINICAL DATA: Status post right hip joint prosthesis placement.

EXAM:
DG C-ARM 1-60 MIN-NO REPORT; PORTABLE PELVIS 1-2 VIEWS

[pelvis ap]
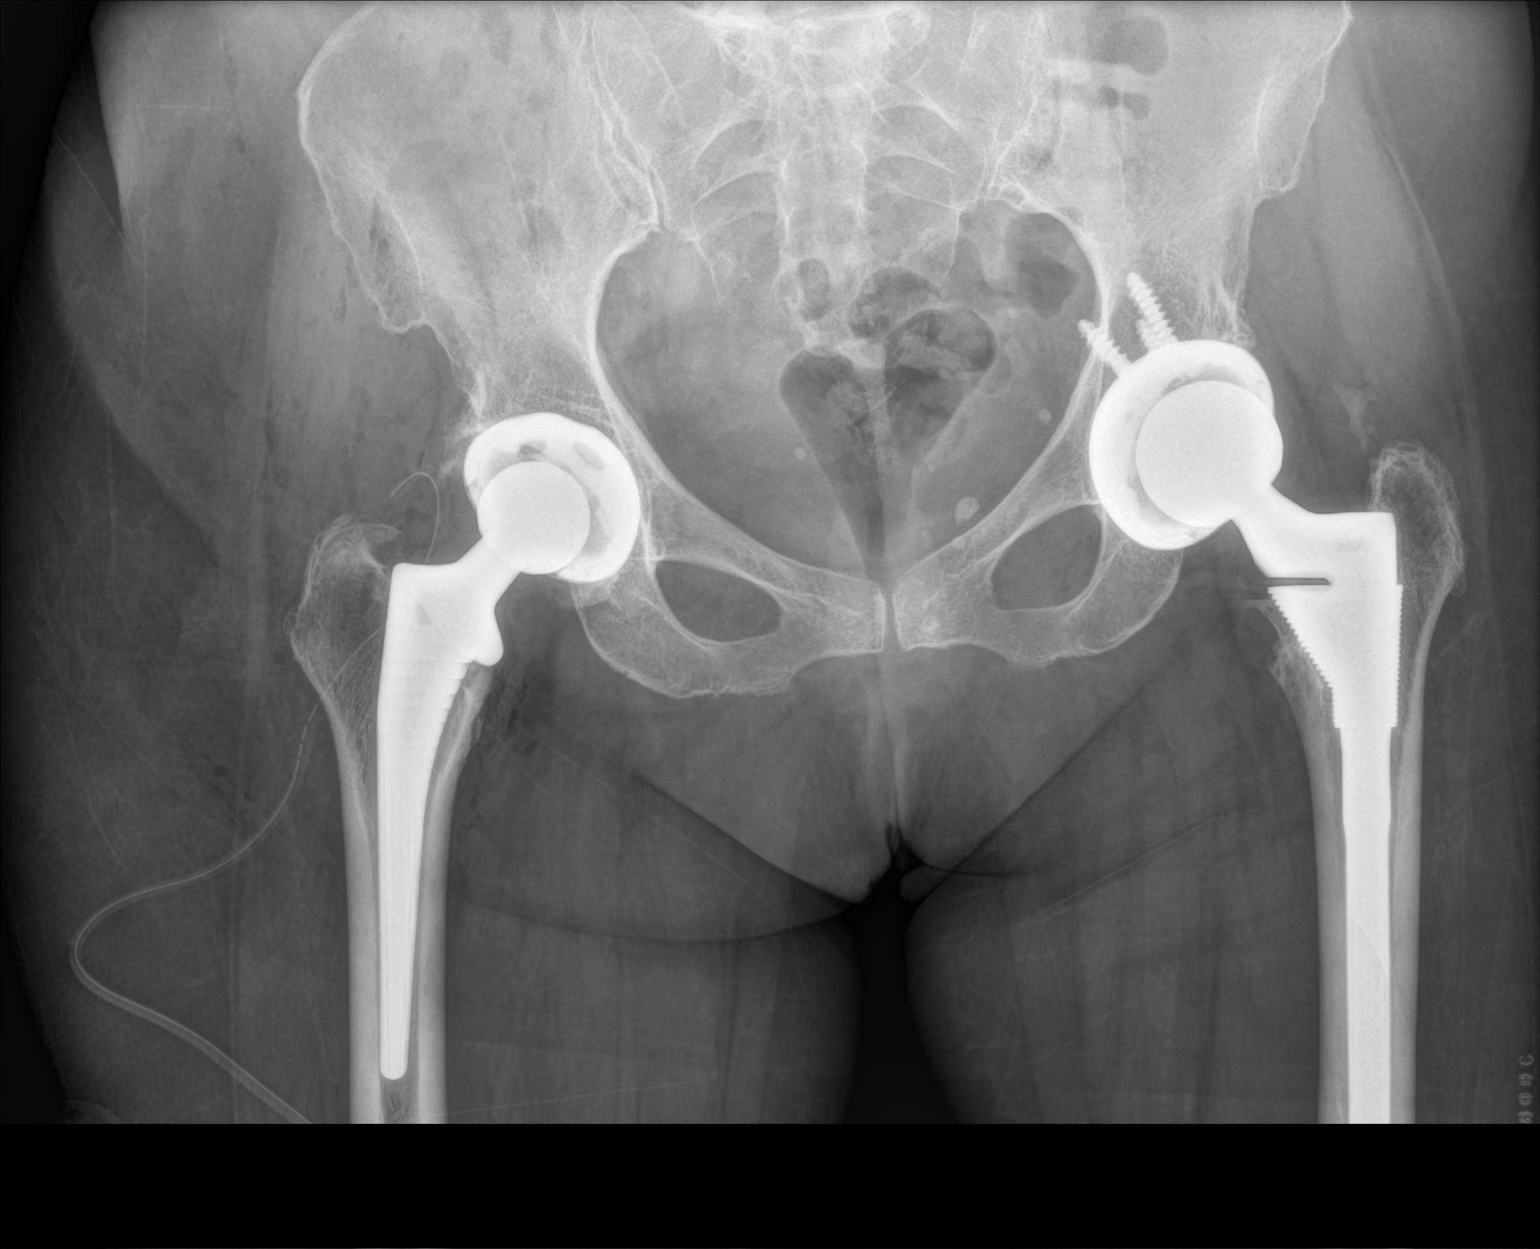

[1 of 1 positions shown; findings below may reference images not displayed]

FINDINGS: The patient has undergone low left hip arthroplasty. Radiographic
positioning of the prosthetic components is good. The interface of
the prosthesis with the native bone is normal. A pre-existing left
hip prosthesis is present. A surgical drain line is present on the
right at the level of the hip joint.
IMPRESSION: There is no immediate postprocedure complication following lefthip
joint prosthesis placement.

## 2017-09-03 DIAGNOSIS — M0589 Other rheumatoid arthritis with rheumatoid factor of multiple sites: Secondary | ICD-10-CM | POA: Diagnosis not present

## 2017-09-08 DIAGNOSIS — D509 Iron deficiency anemia, unspecified: Secondary | ICD-10-CM | POA: Diagnosis not present

## 2017-09-14 DIAGNOSIS — I10 Essential (primary) hypertension: Secondary | ICD-10-CM | POA: Diagnosis not present

## 2017-09-14 DIAGNOSIS — K644 Residual hemorrhoidal skin tags: Secondary | ICD-10-CM | POA: Diagnosis not present

## 2017-09-14 DIAGNOSIS — K5730 Diverticulosis of large intestine without perforation or abscess without bleeding: Secondary | ICD-10-CM | POA: Diagnosis not present

## 2017-09-14 DIAGNOSIS — B9681 Helicobacter pylori [H. pylori] as the cause of diseases classified elsewhere: Secondary | ICD-10-CM | POA: Diagnosis not present

## 2017-09-14 DIAGNOSIS — D509 Iron deficiency anemia, unspecified: Secondary | ICD-10-CM | POA: Diagnosis not present

## 2017-09-14 DIAGNOSIS — Z7982 Long term (current) use of aspirin: Secondary | ICD-10-CM | POA: Diagnosis not present

## 2017-09-14 DIAGNOSIS — K449 Diaphragmatic hernia without obstruction or gangrene: Secondary | ICD-10-CM | POA: Diagnosis not present

## 2017-09-14 DIAGNOSIS — K210 Gastro-esophageal reflux disease with esophagitis: Secondary | ICD-10-CM | POA: Diagnosis not present

## 2017-09-14 DIAGNOSIS — K219 Gastro-esophageal reflux disease without esophagitis: Secondary | ICD-10-CM | POA: Diagnosis not present

## 2017-09-14 DIAGNOSIS — Z79899 Other long term (current) drug therapy: Secondary | ICD-10-CM | POA: Diagnosis not present

## 2017-09-14 DIAGNOSIS — K2950 Unspecified chronic gastritis without bleeding: Secondary | ICD-10-CM | POA: Diagnosis not present

## 2017-09-14 DIAGNOSIS — Z1211 Encounter for screening for malignant neoplasm of colon: Secondary | ICD-10-CM | POA: Diagnosis not present

## 2017-09-14 DIAGNOSIS — K208 Other esophagitis: Secondary | ICD-10-CM | POA: Diagnosis not present

## 2017-09-14 DIAGNOSIS — R1310 Dysphagia, unspecified: Secondary | ICD-10-CM | POA: Diagnosis not present

## 2017-09-14 DIAGNOSIS — Z86010 Personal history of colonic polyps: Secondary | ICD-10-CM | POA: Diagnosis not present

## 2017-09-17 DIAGNOSIS — M0589 Other rheumatoid arthritis with rheumatoid factor of multiple sites: Secondary | ICD-10-CM | POA: Diagnosis not present

## 2017-09-18 DIAGNOSIS — N390 Urinary tract infection, site not specified: Secondary | ICD-10-CM | POA: Diagnosis not present

## 2017-09-23 DIAGNOSIS — K210 Gastro-esophageal reflux disease with esophagitis: Secondary | ICD-10-CM | POA: Diagnosis not present

## 2017-09-23 DIAGNOSIS — N390 Urinary tract infection, site not specified: Secondary | ICD-10-CM | POA: Diagnosis not present

## 2017-09-23 DIAGNOSIS — D509 Iron deficiency anemia, unspecified: Secondary | ICD-10-CM | POA: Diagnosis not present

## 2017-09-23 DIAGNOSIS — Z79899 Other long term (current) drug therapy: Secondary | ICD-10-CM | POA: Diagnosis not present

## 2017-10-01 DIAGNOSIS — M0589 Other rheumatoid arthritis with rheumatoid factor of multiple sites: Secondary | ICD-10-CM | POA: Diagnosis not present

## 2017-10-01 DIAGNOSIS — Z79899 Other long term (current) drug therapy: Secondary | ICD-10-CM | POA: Diagnosis not present

## 2017-10-13 DIAGNOSIS — L570 Actinic keratosis: Secondary | ICD-10-CM | POA: Diagnosis not present

## 2017-10-13 DIAGNOSIS — L219 Seborrheic dermatitis, unspecified: Secondary | ICD-10-CM | POA: Diagnosis not present

## 2017-10-13 DIAGNOSIS — L821 Other seborrheic keratosis: Secondary | ICD-10-CM | POA: Diagnosis not present

## 2017-10-13 DIAGNOSIS — L578 Other skin changes due to chronic exposure to nonionizing radiation: Secondary | ICD-10-CM | POA: Diagnosis not present

## 2017-10-19 DIAGNOSIS — Z1231 Encounter for screening mammogram for malignant neoplasm of breast: Secondary | ICD-10-CM | POA: Diagnosis not present

## 2017-11-01 DIAGNOSIS — Z79899 Other long term (current) drug therapy: Secondary | ICD-10-CM | POA: Diagnosis not present

## 2017-11-01 DIAGNOSIS — M0589 Other rheumatoid arthritis with rheumatoid factor of multiple sites: Secondary | ICD-10-CM | POA: Diagnosis not present

## 2017-11-16 DIAGNOSIS — M150 Primary generalized (osteo)arthritis: Secondary | ICD-10-CM | POA: Diagnosis not present

## 2017-11-16 DIAGNOSIS — G629 Polyneuropathy, unspecified: Secondary | ICD-10-CM | POA: Diagnosis not present

## 2017-11-16 DIAGNOSIS — Z79899 Other long term (current) drug therapy: Secondary | ICD-10-CM | POA: Diagnosis not present

## 2017-11-16 DIAGNOSIS — M5136 Other intervertebral disc degeneration, lumbar region: Secondary | ICD-10-CM | POA: Diagnosis not present

## 2017-11-16 DIAGNOSIS — Z6830 Body mass index (BMI) 30.0-30.9, adult: Secondary | ICD-10-CM | POA: Diagnosis not present

## 2017-11-16 DIAGNOSIS — M0589 Other rheumatoid arthritis with rheumatoid factor of multiple sites: Secondary | ICD-10-CM | POA: Diagnosis not present

## 2017-11-16 DIAGNOSIS — E669 Obesity, unspecified: Secondary | ICD-10-CM | POA: Diagnosis not present

## 2017-11-29 DIAGNOSIS — M0589 Other rheumatoid arthritis with rheumatoid factor of multiple sites: Secondary | ICD-10-CM | POA: Diagnosis not present

## 2017-12-27 DIAGNOSIS — M0589 Other rheumatoid arthritis with rheumatoid factor of multiple sites: Secondary | ICD-10-CM | POA: Diagnosis not present

## 2018-01-24 DIAGNOSIS — M0589 Other rheumatoid arthritis with rheumatoid factor of multiple sites: Secondary | ICD-10-CM | POA: Diagnosis not present

## 2018-01-24 DIAGNOSIS — Z79899 Other long term (current) drug therapy: Secondary | ICD-10-CM | POA: Diagnosis not present

## 2018-01-27 DIAGNOSIS — M0589 Other rheumatoid arthritis with rheumatoid factor of multiple sites: Secondary | ICD-10-CM | POA: Diagnosis not present

## 2018-01-27 DIAGNOSIS — Z6830 Body mass index (BMI) 30.0-30.9, adult: Secondary | ICD-10-CM | POA: Diagnosis not present

## 2018-01-27 DIAGNOSIS — Z79899 Other long term (current) drug therapy: Secondary | ICD-10-CM | POA: Diagnosis not present

## 2018-01-27 DIAGNOSIS — G629 Polyneuropathy, unspecified: Secondary | ICD-10-CM | POA: Diagnosis not present

## 2018-01-27 DIAGNOSIS — M5136 Other intervertebral disc degeneration, lumbar region: Secondary | ICD-10-CM | POA: Diagnosis not present

## 2018-01-27 DIAGNOSIS — M150 Primary generalized (osteo)arthritis: Secondary | ICD-10-CM | POA: Diagnosis not present

## 2018-01-27 DIAGNOSIS — E669 Obesity, unspecified: Secondary | ICD-10-CM | POA: Diagnosis not present

## 2018-02-07 DIAGNOSIS — K210 Gastro-esophageal reflux disease with esophagitis: Secondary | ICD-10-CM | POA: Diagnosis not present

## 2018-02-07 DIAGNOSIS — R5381 Other malaise: Secondary | ICD-10-CM | POA: Diagnosis not present

## 2018-02-07 DIAGNOSIS — M069 Rheumatoid arthritis, unspecified: Secondary | ICD-10-CM | POA: Diagnosis not present

## 2018-02-07 DIAGNOSIS — I10 Essential (primary) hypertension: Secondary | ICD-10-CM | POA: Diagnosis not present

## 2018-02-07 DIAGNOSIS — R5383 Other fatigue: Secondary | ICD-10-CM | POA: Diagnosis not present

## 2018-02-07 DIAGNOSIS — K5750 Diverticulosis of both small and large intestine without perforation or abscess without bleeding: Secondary | ICD-10-CM | POA: Diagnosis not present

## 2018-02-07 DIAGNOSIS — D509 Iron deficiency anemia, unspecified: Secondary | ICD-10-CM | POA: Diagnosis not present

## 2018-02-07 DIAGNOSIS — E782 Mixed hyperlipidemia: Secondary | ICD-10-CM | POA: Diagnosis not present

## 2018-02-07 DIAGNOSIS — G2581 Restless legs syndrome: Secondary | ICD-10-CM | POA: Diagnosis not present

## 2018-02-07 DIAGNOSIS — Z79899 Other long term (current) drug therapy: Secondary | ICD-10-CM | POA: Diagnosis not present

## 2018-02-07 DIAGNOSIS — Z0000 Encounter for general adult medical examination without abnormal findings: Secondary | ICD-10-CM | POA: Diagnosis not present

## 2018-02-07 DIAGNOSIS — E538 Deficiency of other specified B group vitamins: Secondary | ICD-10-CM | POA: Diagnosis not present

## 2018-02-21 DIAGNOSIS — M0589 Other rheumatoid arthritis with rheumatoid factor of multiple sites: Secondary | ICD-10-CM | POA: Diagnosis not present

## 2018-03-21 DIAGNOSIS — M0589 Other rheumatoid arthritis with rheumatoid factor of multiple sites: Secondary | ICD-10-CM | POA: Diagnosis not present

## 2018-04-11 DIAGNOSIS — L570 Actinic keratosis: Secondary | ICD-10-CM | POA: Diagnosis not present

## 2018-04-11 DIAGNOSIS — L820 Inflamed seborrheic keratosis: Secondary | ICD-10-CM | POA: Diagnosis not present

## 2018-04-11 DIAGNOSIS — L728 Other follicular cysts of the skin and subcutaneous tissue: Secondary | ICD-10-CM | POA: Diagnosis not present

## 2018-04-17 DIAGNOSIS — R300 Dysuria: Secondary | ICD-10-CM | POA: Diagnosis not present

## 2018-04-18 DIAGNOSIS — M0589 Other rheumatoid arthritis with rheumatoid factor of multiple sites: Secondary | ICD-10-CM | POA: Diagnosis not present

## 2018-04-18 DIAGNOSIS — Z79899 Other long term (current) drug therapy: Secondary | ICD-10-CM | POA: Diagnosis not present

## 2018-04-28 DIAGNOSIS — Z23 Encounter for immunization: Secondary | ICD-10-CM | POA: Diagnosis not present

## 2018-04-28 DIAGNOSIS — G629 Polyneuropathy, unspecified: Secondary | ICD-10-CM | POA: Diagnosis not present

## 2018-04-28 DIAGNOSIS — Z6830 Body mass index (BMI) 30.0-30.9, adult: Secondary | ICD-10-CM | POA: Diagnosis not present

## 2018-04-28 DIAGNOSIS — Z79899 Other long term (current) drug therapy: Secondary | ICD-10-CM | POA: Diagnosis not present

## 2018-04-28 DIAGNOSIS — E669 Obesity, unspecified: Secondary | ICD-10-CM | POA: Diagnosis not present

## 2018-04-28 DIAGNOSIS — M150 Primary generalized (osteo)arthritis: Secondary | ICD-10-CM | POA: Diagnosis not present

## 2018-04-28 DIAGNOSIS — M0589 Other rheumatoid arthritis with rheumatoid factor of multiple sites: Secondary | ICD-10-CM | POA: Diagnosis not present

## 2018-04-28 DIAGNOSIS — M5136 Other intervertebral disc degeneration, lumbar region: Secondary | ICD-10-CM | POA: Diagnosis not present

## 2018-05-16 DIAGNOSIS — M0589 Other rheumatoid arthritis with rheumatoid factor of multiple sites: Secondary | ICD-10-CM | POA: Diagnosis not present

## 2018-06-14 DIAGNOSIS — M0589 Other rheumatoid arthritis with rheumatoid factor of multiple sites: Secondary | ICD-10-CM | POA: Diagnosis not present

## 2018-07-12 DIAGNOSIS — M0589 Other rheumatoid arthritis with rheumatoid factor of multiple sites: Secondary | ICD-10-CM | POA: Diagnosis not present

## 2018-07-28 DIAGNOSIS — E669 Obesity, unspecified: Secondary | ICD-10-CM | POA: Diagnosis not present

## 2018-07-28 DIAGNOSIS — Z79899 Other long term (current) drug therapy: Secondary | ICD-10-CM | POA: Diagnosis not present

## 2018-07-28 DIAGNOSIS — G629 Polyneuropathy, unspecified: Secondary | ICD-10-CM | POA: Diagnosis not present

## 2018-07-28 DIAGNOSIS — M5136 Other intervertebral disc degeneration, lumbar region: Secondary | ICD-10-CM | POA: Diagnosis not present

## 2018-07-28 DIAGNOSIS — M0589 Other rheumatoid arthritis with rheumatoid factor of multiple sites: Secondary | ICD-10-CM | POA: Diagnosis not present

## 2018-07-28 DIAGNOSIS — Z6830 Body mass index (BMI) 30.0-30.9, adult: Secondary | ICD-10-CM | POA: Diagnosis not present

## 2018-07-28 DIAGNOSIS — M150 Primary generalized (osteo)arthritis: Secondary | ICD-10-CM | POA: Diagnosis not present

## 2018-08-09 DIAGNOSIS — M0589 Other rheumatoid arthritis with rheumatoid factor of multiple sites: Secondary | ICD-10-CM | POA: Diagnosis not present

## 2018-08-09 DIAGNOSIS — Z79899 Other long term (current) drug therapy: Secondary | ICD-10-CM | POA: Diagnosis not present

## 2018-08-23 DIAGNOSIS — H40053 Ocular hypertension, bilateral: Secondary | ICD-10-CM | POA: Diagnosis not present

## 2018-09-06 DIAGNOSIS — R300 Dysuria: Secondary | ICD-10-CM | POA: Diagnosis not present

## 2018-09-06 DIAGNOSIS — I10 Essential (primary) hypertension: Secondary | ICD-10-CM | POA: Diagnosis not present

## 2018-09-06 DIAGNOSIS — M069 Rheumatoid arthritis, unspecified: Secondary | ICD-10-CM | POA: Diagnosis not present

## 2018-09-06 DIAGNOSIS — N390 Urinary tract infection, site not specified: Secondary | ICD-10-CM | POA: Diagnosis not present

## 2018-09-15 DIAGNOSIS — M150 Primary generalized (osteo)arthritis: Secondary | ICD-10-CM | POA: Diagnosis not present

## 2018-09-15 DIAGNOSIS — E669 Obesity, unspecified: Secondary | ICD-10-CM | POA: Diagnosis not present

## 2018-09-15 DIAGNOSIS — Z79899 Other long term (current) drug therapy: Secondary | ICD-10-CM | POA: Diagnosis not present

## 2018-09-15 DIAGNOSIS — G629 Polyneuropathy, unspecified: Secondary | ICD-10-CM | POA: Diagnosis not present

## 2018-09-15 DIAGNOSIS — Z6830 Body mass index (BMI) 30.0-30.9, adult: Secondary | ICD-10-CM | POA: Diagnosis not present

## 2018-09-15 DIAGNOSIS — M5136 Other intervertebral disc degeneration, lumbar region: Secondary | ICD-10-CM | POA: Diagnosis not present

## 2018-09-15 DIAGNOSIS — M0589 Other rheumatoid arthritis with rheumatoid factor of multiple sites: Secondary | ICD-10-CM | POA: Diagnosis not present

## 2018-11-16 DIAGNOSIS — M0589 Other rheumatoid arthritis with rheumatoid factor of multiple sites: Secondary | ICD-10-CM | POA: Diagnosis not present

## 2018-11-16 DIAGNOSIS — M150 Primary generalized (osteo)arthritis: Secondary | ICD-10-CM | POA: Diagnosis not present

## 2018-11-16 DIAGNOSIS — G629 Polyneuropathy, unspecified: Secondary | ICD-10-CM | POA: Diagnosis not present

## 2018-11-16 DIAGNOSIS — E669 Obesity, unspecified: Secondary | ICD-10-CM | POA: Diagnosis not present

## 2018-11-16 DIAGNOSIS — Z79899 Other long term (current) drug therapy: Secondary | ICD-10-CM | POA: Diagnosis not present

## 2018-11-16 DIAGNOSIS — Z6830 Body mass index (BMI) 30.0-30.9, adult: Secondary | ICD-10-CM | POA: Diagnosis not present

## 2018-11-16 DIAGNOSIS — M5136 Other intervertebral disc degeneration, lumbar region: Secondary | ICD-10-CM | POA: Diagnosis not present

## 2018-11-17 DIAGNOSIS — M0589 Other rheumatoid arthritis with rheumatoid factor of multiple sites: Secondary | ICD-10-CM | POA: Diagnosis not present

## 2019-02-08 DIAGNOSIS — K5750 Diverticulosis of both small and large intestine without perforation or abscess without bleeding: Secondary | ICD-10-CM | POA: Diagnosis not present

## 2019-02-08 DIAGNOSIS — J0191 Acute recurrent sinusitis, unspecified: Secondary | ICD-10-CM | POA: Diagnosis not present

## 2019-02-08 DIAGNOSIS — I493 Ventricular premature depolarization: Secondary | ICD-10-CM | POA: Diagnosis not present

## 2019-02-08 DIAGNOSIS — Z0000 Encounter for general adult medical examination without abnormal findings: Secondary | ICD-10-CM | POA: Diagnosis not present

## 2019-02-08 DIAGNOSIS — E782 Mixed hyperlipidemia: Secondary | ICD-10-CM | POA: Diagnosis not present

## 2019-02-08 DIAGNOSIS — Z79899 Other long term (current) drug therapy: Secondary | ICD-10-CM | POA: Diagnosis not present

## 2019-02-08 DIAGNOSIS — I10 Essential (primary) hypertension: Secondary | ICD-10-CM | POA: Diagnosis not present

## 2019-02-08 DIAGNOSIS — E538 Deficiency of other specified B group vitamins: Secondary | ICD-10-CM | POA: Diagnosis not present

## 2019-02-08 DIAGNOSIS — K210 Gastro-esophageal reflux disease with esophagitis: Secondary | ICD-10-CM | POA: Diagnosis not present

## 2019-02-08 DIAGNOSIS — M069 Rheumatoid arthritis, unspecified: Secondary | ICD-10-CM | POA: Diagnosis not present

## 2019-02-08 DIAGNOSIS — Z0389 Encounter for observation for other suspected diseases and conditions ruled out: Secondary | ICD-10-CM | POA: Diagnosis not present

## 2019-02-08 DIAGNOSIS — D509 Iron deficiency anemia, unspecified: Secondary | ICD-10-CM | POA: Diagnosis not present

## 2019-02-08 DIAGNOSIS — R5381 Other malaise: Secondary | ICD-10-CM | POA: Diagnosis not present

## 2019-02-16 DIAGNOSIS — M0589 Other rheumatoid arthritis with rheumatoid factor of multiple sites: Secondary | ICD-10-CM | POA: Diagnosis not present

## 2019-02-16 DIAGNOSIS — G629 Polyneuropathy, unspecified: Secondary | ICD-10-CM | POA: Diagnosis not present

## 2019-02-16 DIAGNOSIS — Z6830 Body mass index (BMI) 30.0-30.9, adult: Secondary | ICD-10-CM | POA: Diagnosis not present

## 2019-02-16 DIAGNOSIS — Z79899 Other long term (current) drug therapy: Secondary | ICD-10-CM | POA: Diagnosis not present

## 2019-02-16 DIAGNOSIS — M5136 Other intervertebral disc degeneration, lumbar region: Secondary | ICD-10-CM | POA: Diagnosis not present

## 2019-02-16 DIAGNOSIS — R6884 Jaw pain: Secondary | ICD-10-CM | POA: Diagnosis not present

## 2019-02-16 DIAGNOSIS — M150 Primary generalized (osteo)arthritis: Secondary | ICD-10-CM | POA: Diagnosis not present

## 2019-02-16 DIAGNOSIS — E669 Obesity, unspecified: Secondary | ICD-10-CM | POA: Diagnosis not present

## 2019-02-16 DIAGNOSIS — R070 Pain in throat: Secondary | ICD-10-CM | POA: Diagnosis not present

## 2019-03-30 DIAGNOSIS — R5381 Other malaise: Secondary | ICD-10-CM | POA: Diagnosis not present

## 2019-03-30 DIAGNOSIS — I10 Essential (primary) hypertension: Secondary | ICD-10-CM | POA: Diagnosis not present

## 2019-03-30 DIAGNOSIS — Z23 Encounter for immunization: Secondary | ICD-10-CM | POA: Diagnosis not present

## 2019-03-30 DIAGNOSIS — M069 Rheumatoid arthritis, unspecified: Secondary | ICD-10-CM | POA: Diagnosis not present

## 2019-03-30 DIAGNOSIS — R55 Syncope and collapse: Secondary | ICD-10-CM | POA: Diagnosis not present

## 2019-03-30 DIAGNOSIS — R5383 Other fatigue: Secondary | ICD-10-CM | POA: Diagnosis not present

## 2019-04-03 DIAGNOSIS — R55 Syncope and collapse: Secondary | ICD-10-CM | POA: Diagnosis not present

## 2019-04-06 DIAGNOSIS — Z23 Encounter for immunization: Secondary | ICD-10-CM | POA: Diagnosis not present

## 2019-04-11 DIAGNOSIS — Z1231 Encounter for screening mammogram for malignant neoplasm of breast: Secondary | ICD-10-CM | POA: Diagnosis not present

## 2019-05-25 DIAGNOSIS — Z7952 Long term (current) use of systemic steroids: Secondary | ICD-10-CM | POA: Diagnosis not present

## 2019-05-25 DIAGNOSIS — Z79899 Other long term (current) drug therapy: Secondary | ICD-10-CM | POA: Diagnosis not present

## 2019-05-25 DIAGNOSIS — M0589 Other rheumatoid arthritis with rheumatoid factor of multiple sites: Secondary | ICD-10-CM | POA: Diagnosis not present

## 2019-05-25 DIAGNOSIS — R070 Pain in throat: Secondary | ICD-10-CM | POA: Diagnosis not present

## 2019-05-25 DIAGNOSIS — M5136 Other intervertebral disc degeneration, lumbar region: Secondary | ICD-10-CM | POA: Diagnosis not present

## 2019-05-25 DIAGNOSIS — Z6830 Body mass index (BMI) 30.0-30.9, adult: Secondary | ICD-10-CM | POA: Diagnosis not present

## 2019-05-25 DIAGNOSIS — R6884 Jaw pain: Secondary | ICD-10-CM | POA: Diagnosis not present

## 2019-05-25 DIAGNOSIS — M150 Primary generalized (osteo)arthritis: Secondary | ICD-10-CM | POA: Diagnosis not present

## 2019-05-25 DIAGNOSIS — E669 Obesity, unspecified: Secondary | ICD-10-CM | POA: Diagnosis not present

## 2019-05-25 DIAGNOSIS — G629 Polyneuropathy, unspecified: Secondary | ICD-10-CM | POA: Diagnosis not present

## 2019-06-01 DIAGNOSIS — Z7952 Long term (current) use of systemic steroids: Secondary | ICD-10-CM | POA: Diagnosis not present

## 2019-06-01 DIAGNOSIS — M810 Age-related osteoporosis without current pathological fracture: Secondary | ICD-10-CM | POA: Diagnosis not present

## 2019-07-11 DIAGNOSIS — R0989 Other specified symptoms and signs involving the circulatory and respiratory systems: Secondary | ICD-10-CM | POA: Diagnosis not present

## 2019-07-18 DIAGNOSIS — R0989 Other specified symptoms and signs involving the circulatory and respiratory systems: Secondary | ICD-10-CM | POA: Diagnosis not present

## 2019-08-12 DIAGNOSIS — Z20828 Contact with and (suspected) exposure to other viral communicable diseases: Secondary | ICD-10-CM | POA: Diagnosis not present

## 2019-08-12 DIAGNOSIS — R05 Cough: Secondary | ICD-10-CM | POA: Diagnosis not present

## 2019-08-20 DIAGNOSIS — N3091 Cystitis, unspecified with hematuria: Secondary | ICD-10-CM | POA: Diagnosis not present

## 2019-08-21 DIAGNOSIS — R109 Unspecified abdominal pain: Secondary | ICD-10-CM | POA: Diagnosis not present

## 2019-08-21 DIAGNOSIS — R300 Dysuria: Secondary | ICD-10-CM | POA: Diagnosis not present

## 2019-09-05 DIAGNOSIS — M150 Primary generalized (osteo)arthritis: Secondary | ICD-10-CM | POA: Diagnosis not present

## 2019-09-05 DIAGNOSIS — E669 Obesity, unspecified: Secondary | ICD-10-CM | POA: Diagnosis not present

## 2019-09-05 DIAGNOSIS — Z6830 Body mass index (BMI) 30.0-30.9, adult: Secondary | ICD-10-CM | POA: Diagnosis not present

## 2019-09-05 DIAGNOSIS — M0589 Other rheumatoid arthritis with rheumatoid factor of multiple sites: Secondary | ICD-10-CM | POA: Diagnosis not present

## 2019-09-05 DIAGNOSIS — G629 Polyneuropathy, unspecified: Secondary | ICD-10-CM | POA: Diagnosis not present

## 2019-09-05 DIAGNOSIS — R070 Pain in throat: Secondary | ICD-10-CM | POA: Diagnosis not present

## 2019-09-05 DIAGNOSIS — R6884 Jaw pain: Secondary | ICD-10-CM | POA: Diagnosis not present

## 2019-09-05 DIAGNOSIS — Z7952 Long term (current) use of systemic steroids: Secondary | ICD-10-CM | POA: Diagnosis not present

## 2019-09-05 DIAGNOSIS — Z79899 Other long term (current) drug therapy: Secondary | ICD-10-CM | POA: Diagnosis not present

## 2019-09-05 DIAGNOSIS — M5136 Other intervertebral disc degeneration, lumbar region: Secondary | ICD-10-CM | POA: Diagnosis not present

## 2019-09-12 DIAGNOSIS — E538 Deficiency of other specified B group vitamins: Secondary | ICD-10-CM | POA: Diagnosis not present

## 2019-09-12 DIAGNOSIS — Z862 Personal history of diseases of the blood and blood-forming organs and certain disorders involving the immune mechanism: Secondary | ICD-10-CM | POA: Diagnosis not present

## 2019-09-12 DIAGNOSIS — I10 Essential (primary) hypertension: Secondary | ICD-10-CM | POA: Diagnosis not present

## 2019-09-12 DIAGNOSIS — M069 Rheumatoid arthritis, unspecified: Secondary | ICD-10-CM | POA: Diagnosis not present

## 2019-09-12 DIAGNOSIS — K2100 Gastro-esophageal reflux disease with esophagitis, without bleeding: Secondary | ICD-10-CM | POA: Diagnosis not present

## 2019-09-12 DIAGNOSIS — N9089 Other specified noninflammatory disorders of vulva and perineum: Secondary | ICD-10-CM | POA: Diagnosis not present

## 2019-09-22 DIAGNOSIS — D071 Carcinoma in situ of vulva: Secondary | ICD-10-CM | POA: Diagnosis not present

## 2019-09-22 DIAGNOSIS — N9089 Other specified noninflammatory disorders of vulva and perineum: Secondary | ICD-10-CM | POA: Diagnosis not present

## 2019-10-09 DIAGNOSIS — D071 Carcinoma in situ of vulva: Secondary | ICD-10-CM | POA: Diagnosis not present

## 2019-10-16 DIAGNOSIS — D071 Carcinoma in situ of vulva: Secondary | ICD-10-CM | POA: Diagnosis not present

## 2019-10-18 DIAGNOSIS — D225 Melanocytic nevi of trunk: Secondary | ICD-10-CM | POA: Diagnosis not present

## 2019-10-18 DIAGNOSIS — L578 Other skin changes due to chronic exposure to nonionizing radiation: Secondary | ICD-10-CM | POA: Diagnosis not present

## 2019-10-18 DIAGNOSIS — L814 Other melanin hyperpigmentation: Secondary | ICD-10-CM | POA: Diagnosis not present

## 2019-10-18 DIAGNOSIS — L820 Inflamed seborrheic keratosis: Secondary | ICD-10-CM | POA: Diagnosis not present

## 2019-10-18 DIAGNOSIS — C44519 Basal cell carcinoma of skin of other part of trunk: Secondary | ICD-10-CM | POA: Diagnosis not present

## 2019-10-25 DIAGNOSIS — C44519 Basal cell carcinoma of skin of other part of trunk: Secondary | ICD-10-CM | POA: Diagnosis not present

## 2019-11-07 DIAGNOSIS — Z20822 Contact with and (suspected) exposure to covid-19: Secondary | ICD-10-CM | POA: Diagnosis not present

## 2019-11-07 DIAGNOSIS — Z01812 Encounter for preprocedural laboratory examination: Secondary | ICD-10-CM | POA: Diagnosis not present

## 2019-11-07 DIAGNOSIS — D071 Carcinoma in situ of vulva: Secondary | ICD-10-CM | POA: Diagnosis not present

## 2019-11-14 DIAGNOSIS — D280 Benign neoplasm of vulva: Secondary | ICD-10-CM | POA: Diagnosis not present

## 2019-11-14 DIAGNOSIS — N9089 Other specified noninflammatory disorders of vulva and perineum: Secondary | ICD-10-CM | POA: Diagnosis not present

## 2019-11-14 DIAGNOSIS — C518 Malignant neoplasm of overlapping sites of vulva: Secondary | ICD-10-CM | POA: Diagnosis not present

## 2019-11-14 DIAGNOSIS — D071 Carcinoma in situ of vulva: Secondary | ICD-10-CM | POA: Diagnosis not present

## 2019-11-14 DIAGNOSIS — C510 Malignant neoplasm of labium majus: Secondary | ICD-10-CM | POA: Diagnosis not present

## 2019-11-27 DIAGNOSIS — C519 Malignant neoplasm of vulva, unspecified: Secondary | ICD-10-CM | POA: Diagnosis not present

## 2019-12-06 DIAGNOSIS — C44519 Basal cell carcinoma of skin of other part of trunk: Secondary | ICD-10-CM | POA: Diagnosis not present

## 2019-12-07 DIAGNOSIS — R070 Pain in throat: Secondary | ICD-10-CM | POA: Diagnosis not present

## 2019-12-07 DIAGNOSIS — Z79899 Other long term (current) drug therapy: Secondary | ICD-10-CM | POA: Diagnosis not present

## 2019-12-07 DIAGNOSIS — M5136 Other intervertebral disc degeneration, lumbar region: Secondary | ICD-10-CM | POA: Diagnosis not present

## 2019-12-07 DIAGNOSIS — M150 Primary generalized (osteo)arthritis: Secondary | ICD-10-CM | POA: Diagnosis not present

## 2019-12-07 DIAGNOSIS — E663 Overweight: Secondary | ICD-10-CM | POA: Diagnosis not present

## 2019-12-07 DIAGNOSIS — Z7952 Long term (current) use of systemic steroids: Secondary | ICD-10-CM | POA: Diagnosis not present

## 2019-12-07 DIAGNOSIS — M0589 Other rheumatoid arthritis with rheumatoid factor of multiple sites: Secondary | ICD-10-CM | POA: Diagnosis not present

## 2019-12-07 DIAGNOSIS — C519 Malignant neoplasm of vulva, unspecified: Secondary | ICD-10-CM | POA: Diagnosis not present

## 2019-12-07 DIAGNOSIS — R6884 Jaw pain: Secondary | ICD-10-CM | POA: Diagnosis not present

## 2019-12-07 DIAGNOSIS — Z6829 Body mass index (BMI) 29.0-29.9, adult: Secondary | ICD-10-CM | POA: Diagnosis not present

## 2019-12-07 DIAGNOSIS — G629 Polyneuropathy, unspecified: Secondary | ICD-10-CM | POA: Diagnosis not present

## 2019-12-12 DIAGNOSIS — N7689 Other specified inflammation of vagina and vulva: Secondary | ICD-10-CM | POA: Diagnosis not present

## 2019-12-12 DIAGNOSIS — C519 Malignant neoplasm of vulva, unspecified: Secondary | ICD-10-CM | POA: Diagnosis not present

## 2019-12-12 DIAGNOSIS — Z9889 Other specified postprocedural states: Secondary | ICD-10-CM | POA: Diagnosis not present

## 2019-12-12 DIAGNOSIS — N766 Ulceration of vulva: Secondary | ICD-10-CM | POA: Diagnosis not present

## 2019-12-19 DIAGNOSIS — C519 Malignant neoplasm of vulva, unspecified: Secondary | ICD-10-CM | POA: Diagnosis not present

## 2019-12-19 DIAGNOSIS — Z9889 Other specified postprocedural states: Secondary | ICD-10-CM | POA: Diagnosis not present

## 2019-12-19 DIAGNOSIS — Z881 Allergy status to other antibiotic agents status: Secondary | ICD-10-CM | POA: Diagnosis not present

## 2019-12-19 DIAGNOSIS — Z880 Allergy status to penicillin: Secondary | ICD-10-CM | POA: Diagnosis not present

## 2020-02-08 DIAGNOSIS — D485 Neoplasm of uncertain behavior of skin: Secondary | ICD-10-CM | POA: Diagnosis not present

## 2020-02-11 DIAGNOSIS — N3091 Cystitis, unspecified with hematuria: Secondary | ICD-10-CM | POA: Diagnosis not present

## 2020-02-11 DIAGNOSIS — R300 Dysuria: Secondary | ICD-10-CM | POA: Diagnosis not present

## 2020-03-19 DIAGNOSIS — I10 Essential (primary) hypertension: Secondary | ICD-10-CM | POA: Diagnosis not present

## 2020-03-19 DIAGNOSIS — K2100 Gastro-esophageal reflux disease with esophagitis, without bleeding: Secondary | ICD-10-CM | POA: Diagnosis not present

## 2020-03-19 DIAGNOSIS — Z0000 Encounter for general adult medical examination without abnormal findings: Secondary | ICD-10-CM | POA: Diagnosis not present

## 2020-03-19 DIAGNOSIS — E782 Mixed hyperlipidemia: Secondary | ICD-10-CM | POA: Diagnosis not present

## 2020-03-19 DIAGNOSIS — E538 Deficiency of other specified B group vitamins: Secondary | ICD-10-CM | POA: Diagnosis not present

## 2020-03-19 DIAGNOSIS — G2581 Restless legs syndrome: Secondary | ICD-10-CM | POA: Diagnosis not present

## 2020-03-19 DIAGNOSIS — C519 Malignant neoplasm of vulva, unspecified: Secondary | ICD-10-CM | POA: Diagnosis not present

## 2020-03-19 DIAGNOSIS — M069 Rheumatoid arthritis, unspecified: Secondary | ICD-10-CM | POA: Diagnosis not present

## 2020-03-19 DIAGNOSIS — R300 Dysuria: Secondary | ICD-10-CM | POA: Diagnosis not present

## 2020-03-19 DIAGNOSIS — Z862 Personal history of diseases of the blood and blood-forming organs and certain disorders involving the immune mechanism: Secondary | ICD-10-CM | POA: Diagnosis not present

## 2020-03-19 DIAGNOSIS — Z23 Encounter for immunization: Secondary | ICD-10-CM | POA: Diagnosis not present

## 2020-03-26 DIAGNOSIS — C519 Malignant neoplasm of vulva, unspecified: Secondary | ICD-10-CM | POA: Diagnosis not present

## 2020-03-26 DIAGNOSIS — Z881 Allergy status to other antibiotic agents status: Secondary | ICD-10-CM | POA: Diagnosis not present

## 2020-03-26 DIAGNOSIS — Z880 Allergy status to penicillin: Secondary | ICD-10-CM | POA: Diagnosis not present

## 2020-04-23 DIAGNOSIS — R070 Pain in throat: Secondary | ICD-10-CM | POA: Diagnosis not present

## 2020-04-23 DIAGNOSIS — E663 Overweight: Secondary | ICD-10-CM | POA: Diagnosis not present

## 2020-04-23 DIAGNOSIS — C519 Malignant neoplasm of vulva, unspecified: Secondary | ICD-10-CM | POA: Diagnosis not present

## 2020-04-23 DIAGNOSIS — M150 Primary generalized (osteo)arthritis: Secondary | ICD-10-CM | POA: Diagnosis not present

## 2020-04-23 DIAGNOSIS — R6884 Jaw pain: Secondary | ICD-10-CM | POA: Diagnosis not present

## 2020-04-23 DIAGNOSIS — M0589 Other rheumatoid arthritis with rheumatoid factor of multiple sites: Secondary | ICD-10-CM | POA: Diagnosis not present

## 2020-04-23 DIAGNOSIS — Z7952 Long term (current) use of systemic steroids: Secondary | ICD-10-CM | POA: Diagnosis not present

## 2020-04-23 DIAGNOSIS — M5136 Other intervertebral disc degeneration, lumbar region: Secondary | ICD-10-CM | POA: Diagnosis not present

## 2020-04-23 DIAGNOSIS — Z79899 Other long term (current) drug therapy: Secondary | ICD-10-CM | POA: Diagnosis not present

## 2020-04-23 DIAGNOSIS — Z6829 Body mass index (BMI) 29.0-29.9, adult: Secondary | ICD-10-CM | POA: Diagnosis not present

## 2020-04-23 DIAGNOSIS — G629 Polyneuropathy, unspecified: Secondary | ICD-10-CM | POA: Diagnosis not present

## 2020-05-06 DIAGNOSIS — L821 Other seborrheic keratosis: Secondary | ICD-10-CM | POA: Diagnosis not present

## 2020-05-06 DIAGNOSIS — C44722 Squamous cell carcinoma of skin of right lower limb, including hip: Secondary | ICD-10-CM | POA: Diagnosis not present

## 2020-05-06 DIAGNOSIS — C44519 Basal cell carcinoma of skin of other part of trunk: Secondary | ICD-10-CM | POA: Diagnosis not present

## 2020-05-19 DIAGNOSIS — R300 Dysuria: Secondary | ICD-10-CM | POA: Diagnosis not present

## 2020-05-19 DIAGNOSIS — N3090 Cystitis, unspecified without hematuria: Secondary | ICD-10-CM | POA: Diagnosis not present

## 2020-06-04 DIAGNOSIS — R309 Painful micturition, unspecified: Secondary | ICD-10-CM | POA: Diagnosis not present

## 2020-06-04 DIAGNOSIS — N3091 Cystitis, unspecified with hematuria: Secondary | ICD-10-CM | POA: Diagnosis not present

## 2020-06-04 DIAGNOSIS — M549 Dorsalgia, unspecified: Secondary | ICD-10-CM | POA: Diagnosis not present

## 2020-06-04 DIAGNOSIS — R300 Dysuria: Secondary | ICD-10-CM | POA: Diagnosis not present

## 2020-06-10 DIAGNOSIS — Z1231 Encounter for screening mammogram for malignant neoplasm of breast: Secondary | ICD-10-CM | POA: Diagnosis not present

## 2020-06-25 DIAGNOSIS — C519 Malignant neoplasm of vulva, unspecified: Secondary | ICD-10-CM | POA: Diagnosis not present

## 2020-06-25 DIAGNOSIS — Z8544 Personal history of malignant neoplasm of other female genital organs: Secondary | ICD-10-CM | POA: Diagnosis not present

## 2020-06-25 DIAGNOSIS — Z880 Allergy status to penicillin: Secondary | ICD-10-CM | POA: Diagnosis not present

## 2020-06-25 DIAGNOSIS — Z08 Encounter for follow-up examination after completed treatment for malignant neoplasm: Secondary | ICD-10-CM | POA: Diagnosis not present

## 2020-06-25 DIAGNOSIS — Z9889 Other specified postprocedural states: Secondary | ICD-10-CM | POA: Diagnosis not present

## 2020-06-25 DIAGNOSIS — Z881 Allergy status to other antibiotic agents status: Secondary | ICD-10-CM | POA: Diagnosis not present

## 2020-07-24 DIAGNOSIS — Z79899 Other long term (current) drug therapy: Secondary | ICD-10-CM | POA: Diagnosis not present

## 2020-07-24 DIAGNOSIS — M79671 Pain in right foot: Secondary | ICD-10-CM | POA: Diagnosis not present

## 2020-07-24 DIAGNOSIS — E663 Overweight: Secondary | ICD-10-CM | POA: Diagnosis not present

## 2020-07-24 DIAGNOSIS — G629 Polyneuropathy, unspecified: Secondary | ICD-10-CM | POA: Diagnosis not present

## 2020-07-24 DIAGNOSIS — M79672 Pain in left foot: Secondary | ICD-10-CM | POA: Diagnosis not present

## 2020-07-24 DIAGNOSIS — C519 Malignant neoplasm of vulva, unspecified: Secondary | ICD-10-CM | POA: Diagnosis not present

## 2020-07-24 DIAGNOSIS — Z6829 Body mass index (BMI) 29.0-29.9, adult: Secondary | ICD-10-CM | POA: Diagnosis not present

## 2020-07-24 DIAGNOSIS — Z7952 Long term (current) use of systemic steroids: Secondary | ICD-10-CM | POA: Diagnosis not present

## 2020-07-24 DIAGNOSIS — M5136 Other intervertebral disc degeneration, lumbar region: Secondary | ICD-10-CM | POA: Diagnosis not present

## 2020-07-24 DIAGNOSIS — M0589 Other rheumatoid arthritis with rheumatoid factor of multiple sites: Secondary | ICD-10-CM | POA: Diagnosis not present

## 2020-07-24 DIAGNOSIS — M150 Primary generalized (osteo)arthritis: Secondary | ICD-10-CM | POA: Diagnosis not present

## 2020-07-26 DIAGNOSIS — H2511 Age-related nuclear cataract, right eye: Secondary | ICD-10-CM | POA: Diagnosis not present

## 2020-07-26 DIAGNOSIS — H2513 Age-related nuclear cataract, bilateral: Secondary | ICD-10-CM | POA: Diagnosis not present

## 2020-07-26 DIAGNOSIS — H25013 Cortical age-related cataract, bilateral: Secondary | ICD-10-CM | POA: Diagnosis not present

## 2020-07-26 DIAGNOSIS — H18413 Arcus senilis, bilateral: Secondary | ICD-10-CM | POA: Diagnosis not present

## 2020-07-26 DIAGNOSIS — H25043 Posterior subcapsular polar age-related cataract, bilateral: Secondary | ICD-10-CM | POA: Diagnosis not present

## 2020-08-01 DIAGNOSIS — L309 Dermatitis, unspecified: Secondary | ICD-10-CM | POA: Diagnosis not present

## 2020-08-07 DIAGNOSIS — H2511 Age-related nuclear cataract, right eye: Secondary | ICD-10-CM | POA: Diagnosis not present

## 2020-08-08 DIAGNOSIS — H2512 Age-related nuclear cataract, left eye: Secondary | ICD-10-CM | POA: Diagnosis not present

## 2020-08-21 DIAGNOSIS — H2512 Age-related nuclear cataract, left eye: Secondary | ICD-10-CM | POA: Diagnosis not present

## 2020-08-21 DIAGNOSIS — H2511 Age-related nuclear cataract, right eye: Secondary | ICD-10-CM | POA: Diagnosis not present

## 2020-09-16 DIAGNOSIS — E782 Mixed hyperlipidemia: Secondary | ICD-10-CM | POA: Diagnosis not present

## 2020-09-16 DIAGNOSIS — I10 Essential (primary) hypertension: Secondary | ICD-10-CM | POA: Diagnosis not present

## 2020-09-16 DIAGNOSIS — Z862 Personal history of diseases of the blood and blood-forming organs and certain disorders involving the immune mechanism: Secondary | ICD-10-CM | POA: Diagnosis not present

## 2020-09-16 DIAGNOSIS — E538 Deficiency of other specified B group vitamins: Secondary | ICD-10-CM | POA: Diagnosis not present

## 2020-09-16 DIAGNOSIS — G2581 Restless legs syndrome: Secondary | ICD-10-CM | POA: Diagnosis not present

## 2020-09-16 DIAGNOSIS — C519 Malignant neoplasm of vulva, unspecified: Secondary | ICD-10-CM | POA: Diagnosis not present

## 2020-09-16 DIAGNOSIS — M069 Rheumatoid arthritis, unspecified: Secondary | ICD-10-CM | POA: Diagnosis not present

## 2020-09-16 DIAGNOSIS — K2100 Gastro-esophageal reflux disease with esophagitis, without bleeding: Secondary | ICD-10-CM | POA: Diagnosis not present

## 2020-09-24 DIAGNOSIS — Z881 Allergy status to other antibiotic agents status: Secondary | ICD-10-CM | POA: Diagnosis not present

## 2020-09-24 DIAGNOSIS — C519 Malignant neoplasm of vulva, unspecified: Secondary | ICD-10-CM | POA: Diagnosis not present

## 2020-09-24 DIAGNOSIS — Z880 Allergy status to penicillin: Secondary | ICD-10-CM | POA: Diagnosis not present

## 2020-09-24 DIAGNOSIS — N952 Postmenopausal atrophic vaginitis: Secondary | ICD-10-CM | POA: Diagnosis not present

## 2020-09-24 DIAGNOSIS — Z08 Encounter for follow-up examination after completed treatment for malignant neoplasm: Secondary | ICD-10-CM | POA: Diagnosis not present

## 2020-09-24 DIAGNOSIS — Z8544 Personal history of malignant neoplasm of other female genital organs: Secondary | ICD-10-CM | POA: Diagnosis not present

## 2020-10-15 DIAGNOSIS — D649 Anemia, unspecified: Secondary | ICD-10-CM | POA: Diagnosis not present

## 2020-10-15 DIAGNOSIS — R7303 Prediabetes: Secondary | ICD-10-CM | POA: Diagnosis not present

## 2020-10-16 DIAGNOSIS — D649 Anemia, unspecified: Secondary | ICD-10-CM | POA: Diagnosis not present

## 2020-12-16 DIAGNOSIS — L821 Other seborrheic keratosis: Secondary | ICD-10-CM | POA: Diagnosis not present

## 2020-12-16 DIAGNOSIS — L309 Dermatitis, unspecified: Secondary | ICD-10-CM | POA: Diagnosis not present

## 2020-12-16 DIAGNOSIS — L814 Other melanin hyperpigmentation: Secondary | ICD-10-CM | POA: Diagnosis not present

## 2020-12-16 DIAGNOSIS — C44519 Basal cell carcinoma of skin of other part of trunk: Secondary | ICD-10-CM | POA: Diagnosis not present

## 2021-01-21 DIAGNOSIS — M069 Rheumatoid arthritis, unspecified: Secondary | ICD-10-CM | POA: Diagnosis not present

## 2021-01-21 DIAGNOSIS — M19011 Primary osteoarthritis, right shoulder: Secondary | ICD-10-CM | POA: Diagnosis not present

## 2021-01-21 DIAGNOSIS — R6884 Jaw pain: Secondary | ICD-10-CM | POA: Diagnosis not present

## 2021-01-22 DIAGNOSIS — L570 Actinic keratosis: Secondary | ICD-10-CM | POA: Diagnosis not present

## 2021-01-22 DIAGNOSIS — C44519 Basal cell carcinoma of skin of other part of trunk: Secondary | ICD-10-CM | POA: Diagnosis not present

## 2021-01-24 DIAGNOSIS — R0989 Other specified symptoms and signs involving the circulatory and respiratory systems: Secondary | ICD-10-CM | POA: Diagnosis not present

## 2021-01-30 DIAGNOSIS — Z7952 Long term (current) use of systemic steroids: Secondary | ICD-10-CM | POA: Diagnosis not present

## 2021-01-30 DIAGNOSIS — G629 Polyneuropathy, unspecified: Secondary | ICD-10-CM | POA: Diagnosis not present

## 2021-01-30 DIAGNOSIS — E663 Overweight: Secondary | ICD-10-CM | POA: Diagnosis not present

## 2021-01-30 DIAGNOSIS — M150 Primary generalized (osteo)arthritis: Secondary | ICD-10-CM | POA: Diagnosis not present

## 2021-01-30 DIAGNOSIS — M5136 Other intervertebral disc degeneration, lumbar region: Secondary | ICD-10-CM | POA: Diagnosis not present

## 2021-01-30 DIAGNOSIS — M0589 Other rheumatoid arthritis with rheumatoid factor of multiple sites: Secondary | ICD-10-CM | POA: Diagnosis not present

## 2021-01-30 DIAGNOSIS — M79672 Pain in left foot: Secondary | ICD-10-CM | POA: Diagnosis not present

## 2021-01-30 DIAGNOSIS — Z79899 Other long term (current) drug therapy: Secondary | ICD-10-CM | POA: Diagnosis not present

## 2021-01-30 DIAGNOSIS — C519 Malignant neoplasm of vulva, unspecified: Secondary | ICD-10-CM | POA: Diagnosis not present

## 2021-01-30 DIAGNOSIS — Z6829 Body mass index (BMI) 29.0-29.9, adult: Secondary | ICD-10-CM | POA: Diagnosis not present

## 2021-01-30 DIAGNOSIS — M79671 Pain in right foot: Secondary | ICD-10-CM | POA: Diagnosis not present

## 2021-02-03 DIAGNOSIS — R509 Fever, unspecified: Secondary | ICD-10-CM | POA: Diagnosis not present

## 2021-02-03 DIAGNOSIS — Z20828 Contact with and (suspected) exposure to other viral communicable diseases: Secondary | ICD-10-CM | POA: Diagnosis not present

## 2021-02-03 DIAGNOSIS — J069 Acute upper respiratory infection, unspecified: Secondary | ICD-10-CM | POA: Diagnosis not present

## 2021-02-03 DIAGNOSIS — R519 Headache, unspecified: Secondary | ICD-10-CM | POA: Diagnosis not present

## 2021-02-03 DIAGNOSIS — R051 Acute cough: Secondary | ICD-10-CM | POA: Diagnosis not present

## 2021-03-26 DIAGNOSIS — Z01419 Encounter for gynecological examination (general) (routine) without abnormal findings: Secondary | ICD-10-CM | POA: Diagnosis not present

## 2021-04-04 DIAGNOSIS — D485 Neoplasm of uncertain behavior of skin: Secondary | ICD-10-CM | POA: Diagnosis not present

## 2021-04-04 DIAGNOSIS — W57XXXA Bitten or stung by nonvenomous insect and other nonvenomous arthropods, initial encounter: Secondary | ICD-10-CM | POA: Diagnosis not present

## 2021-04-04 DIAGNOSIS — C44519 Basal cell carcinoma of skin of other part of trunk: Secondary | ICD-10-CM | POA: Diagnosis not present

## 2021-04-04 DIAGNOSIS — L309 Dermatitis, unspecified: Secondary | ICD-10-CM | POA: Diagnosis not present

## 2021-04-04 DIAGNOSIS — D692 Other nonthrombocytopenic purpura: Secondary | ICD-10-CM | POA: Diagnosis not present

## 2021-05-05 DIAGNOSIS — Z6829 Body mass index (BMI) 29.0-29.9, adult: Secondary | ICD-10-CM | POA: Diagnosis not present

## 2021-05-05 DIAGNOSIS — M79671 Pain in right foot: Secondary | ICD-10-CM | POA: Diagnosis not present

## 2021-05-05 DIAGNOSIS — M150 Primary generalized (osteo)arthritis: Secondary | ICD-10-CM | POA: Diagnosis not present

## 2021-05-05 DIAGNOSIS — M0589 Other rheumatoid arthritis with rheumatoid factor of multiple sites: Secondary | ICD-10-CM | POA: Diagnosis not present

## 2021-05-05 DIAGNOSIS — E663 Overweight: Secondary | ICD-10-CM | POA: Diagnosis not present

## 2021-05-05 DIAGNOSIS — M79672 Pain in left foot: Secondary | ICD-10-CM | POA: Diagnosis not present

## 2021-05-05 DIAGNOSIS — G629 Polyneuropathy, unspecified: Secondary | ICD-10-CM | POA: Diagnosis not present

## 2021-05-05 DIAGNOSIS — C519 Malignant neoplasm of vulva, unspecified: Secondary | ICD-10-CM | POA: Diagnosis not present

## 2021-05-05 DIAGNOSIS — Z79899 Other long term (current) drug therapy: Secondary | ICD-10-CM | POA: Diagnosis not present

## 2021-05-05 DIAGNOSIS — M5136 Other intervertebral disc degeneration, lumbar region: Secondary | ICD-10-CM | POA: Diagnosis not present

## 2021-05-05 DIAGNOSIS — Z7952 Long term (current) use of systemic steroids: Secondary | ICD-10-CM | POA: Diagnosis not present

## 2021-05-08 DIAGNOSIS — Z23 Encounter for immunization: Secondary | ICD-10-CM | POA: Diagnosis not present

## 2021-05-15 DIAGNOSIS — C44519 Basal cell carcinoma of skin of other part of trunk: Secondary | ICD-10-CM | POA: Diagnosis not present

## 2021-05-22 DIAGNOSIS — M069 Rheumatoid arthritis, unspecified: Secondary | ICD-10-CM | POA: Diagnosis not present

## 2021-05-22 DIAGNOSIS — Z0000 Encounter for general adult medical examination without abnormal findings: Secondary | ICD-10-CM | POA: Diagnosis not present

## 2021-05-22 DIAGNOSIS — E538 Deficiency of other specified B group vitamins: Secondary | ICD-10-CM | POA: Diagnosis not present

## 2021-05-22 DIAGNOSIS — E782 Mixed hyperlipidemia: Secondary | ICD-10-CM | POA: Diagnosis not present

## 2021-05-22 DIAGNOSIS — D692 Other nonthrombocytopenic purpura: Secondary | ICD-10-CM | POA: Diagnosis not present

## 2021-05-22 DIAGNOSIS — I10 Essential (primary) hypertension: Secondary | ICD-10-CM | POA: Diagnosis not present

## 2021-05-22 DIAGNOSIS — D649 Anemia, unspecified: Secondary | ICD-10-CM | POA: Diagnosis not present

## 2021-05-22 DIAGNOSIS — H9191 Unspecified hearing loss, right ear: Secondary | ICD-10-CM | POA: Diagnosis not present

## 2021-06-24 DIAGNOSIS — Z20828 Contact with and (suspected) exposure to other viral communicable diseases: Secondary | ICD-10-CM | POA: Diagnosis not present

## 2021-06-24 DIAGNOSIS — R509 Fever, unspecified: Secondary | ICD-10-CM | POA: Diagnosis not present

## 2021-06-24 DIAGNOSIS — J111 Influenza due to unidentified influenza virus with other respiratory manifestations: Secondary | ICD-10-CM | POA: Diagnosis not present

## 2023-02-17 ENCOUNTER — Ambulatory Visit: Payer: PPO | Admitting: Podiatry

## 2023-02-17 ENCOUNTER — Ambulatory Visit (INDEPENDENT_AMBULATORY_CARE_PROVIDER_SITE_OTHER): Payer: PPO

## 2023-02-17 DIAGNOSIS — M05771 Rheumatoid arthritis with rheumatoid factor of right ankle and foot without organ or systems involvement: Secondary | ICD-10-CM | POA: Diagnosis not present

## 2023-02-17 DIAGNOSIS — M778 Other enthesopathies, not elsewhere classified: Secondary | ICD-10-CM

## 2023-02-17 DIAGNOSIS — Q66221 Congenital metatarsus adductus, right foot: Secondary | ICD-10-CM

## 2023-02-17 DIAGNOSIS — Q66222 Congenital metatarsus adductus, left foot: Secondary | ICD-10-CM

## 2023-02-17 DIAGNOSIS — R262 Difficulty in walking, not elsewhere classified: Secondary | ICD-10-CM

## 2023-02-17 DIAGNOSIS — M05772 Rheumatoid arthritis with rheumatoid factor of left ankle and foot without organ or systems involvement: Secondary | ICD-10-CM

## 2023-02-17 DIAGNOSIS — M2062 Acquired deformities of toe(s), unspecified, left foot: Secondary | ICD-10-CM

## 2023-02-17 DIAGNOSIS — M2061 Acquired deformities of toe(s), unspecified, right foot: Secondary | ICD-10-CM

## 2023-02-17 NOTE — Progress Notes (Signed)
Chief Complaint  Patient presents with   Foot Pain    Patient has rheumatoid arthritis and her PA referred her here for an assessment. Patient was told she has osteopenia.    Peripheral Neuropathy    Patient was told she has neuropathy and has been taking gabapentin every night.     HPI: 77 y.o. female presents today with the above concerns.  Past Medical History:  Diagnosis Date   Arthritis    rheumatoid arthritis    GERD (gastroesophageal reflux disease)    History of hiatal hernia    Hypertension    Stroke (HCC)    hx of mini strokes - 8 years ago - no deficits     Past Surgical History:  Procedure Laterality Date   BILATERAL CARPAL TUNNEL RELEASE     bilateral ovaries removal      CHOLECYSTECTOMY     JOINT REPLACEMENT     left knee, left hip, right knee    TONSILLECTOMY     TOTAL HIP ARTHROPLASTY Right 04/17/2015   Procedure: RIGHT TOTAL HIP ARTHROPLASTY ANTERIOR APPROACH;  Surgeon: Ollen Gross, MD;  Location: [facility];  Service: Orthopedics;  Laterality: Right;    Allergies  Allergen Reactions   Penicillins Swelling    .Marland KitchenHas patient had a PCN reaction causing immediate rash, facial/tongue/throat swelling, SOB or lightheadedness with hypotension: Yes Has patient had a PCN reaction causing severe rash involving mucus membranes or skin necrosis: Yes Has patient had a PCN reaction that required hospitalization Yes Has patient had a PCN reaction occurring within the last 10 years: NO If all of the above answers are "NO", then may proceed with Cephalosporin use.    Vancomycin Itching    Severe itching on buttocks    Physical Exam: General: The patient is alert and oriented x3 in no acute distress.  Dermatology: Skin is warm, dry and supple bilateral lower extremities. Interspaces are clear of maceration and debris.    Vascular: Palpable pedal pulses bilaterally. Capillary refill within normal limits.  No appreciable edema.  No erythema or calor.  Neurological:  Light touch sensation grossly intact bilateral feet.   Musculoskeletal Exam: medial angulation of lesser toes bilateral.  Dorsal bony prominence left midfoot.  Mild C-shape foot (indicating metadductus deformity clinically) bilateral.  Radiographic Exam:  Normal osseous mineralization. Right foot has medial angulation of 3rd, 4th and 5th toes at MPJ's. Metadductus deformity noted.  Mild degenerative changes at tarsometatarsal joints.  Small posterior calcaneal spur present.  Left foot has medial angulation of 2nd, 3rd, 4th and 5th toes at MPJ's.  Significant degenerative changes along dorsal aspect of tarsometatarsal joints along medial column (1st and 2nd met - cuneiform joints).    Assessment/Plan of Care: 1. Capsulitis of foot   2. Rheumatoid arthritis involving both feet with positive rheumatoid factor (HCC)   3. Difficulty walking   4. Metatarsus adductus of both feet   5. Deformity of toe of left foot   6. Deformity of toe, right     Discussed clinical findings with patient today.  Rx [employer] for custom shoes with custom inserts.  Her foot type will not properly fit into regular shoe gear without causing areas of irritation and pain.  This will help decrease pain, support deformities and restore function of feet.   Discussed use of toe spacers to "train" the toes to stop medially deviating.  She can wear these for 30 minutes at a time, when at home / not wearing shoes, 2-4x per  day. Recommend surgery as last resort.   Offered Rx Requip to try for 30 days for her restless legs / feet, but she declined.  She'll continue with her gabapentin.  F/u prn   Clerance Lav, DPM, FACFAS [facility]     [address] [place], [place] [address]                Office [phone]  Fax [phone]

## 2023-10-11 ENCOUNTER — Ambulatory Visit (HOSPITAL_BASED_OUTPATIENT_CLINIC_OR_DEPARTMENT_OTHER): Admitting: Radiology

## 2023-10-11 ENCOUNTER — Encounter (HOSPITAL_BASED_OUTPATIENT_CLINIC_OR_DEPARTMENT_OTHER): Payer: Self-pay | Admitting: Emergency Medicine

## 2023-10-11 ENCOUNTER — Ambulatory Visit (INDEPENDENT_AMBULATORY_CARE_PROVIDER_SITE_OTHER)
Admit: 2023-10-11 | Discharge: 2023-10-11 | Disposition: A | Discharge status: Home / Self Care | Attending: Family Medicine | Admitting: Family Medicine

## 2023-10-11 ENCOUNTER — Ambulatory Visit (HOSPITAL_BASED_OUTPATIENT_CLINIC_OR_DEPARTMENT_OTHER): Admission: EM | Admit: 2023-10-11 | Discharge: 2023-10-11 | Disposition: A | Discharge status: Home / Self Care

## 2023-10-11 DIAGNOSIS — M25511 Pain in right shoulder: Secondary | ICD-10-CM

## 2023-10-11 DIAGNOSIS — M0579 Rheumatoid arthritis with rheumatoid factor of multiple sites without organ or systems involvement: Secondary | ICD-10-CM

## 2023-10-11 DIAGNOSIS — M4124 Other idiopathic scoliosis, thoracic region: Secondary | ICD-10-CM

## 2023-10-11 NOTE — ED Triage Notes (Signed)
 Pt c/o right shoulder pain radiating around the neck area started last night.

## 2023-10-11 NOTE — ED Provider Notes (Signed)
 [name]    CSN: [id number] Arrival date & time: 10/11/23  1029      History   Chief Complaint No chief complaint on file.   HPI [name] is a 78 y.o. female.   Patient reports that she has rheumatoid arthritis and has some bone-on-bone joint issues in her right knee.  Most of her rheumatoid pain is right hip and right knee but she has some in her left hip and left knee.  She denies any recent injuries.  She started with acute right shoulder pain last night (10/10/2023).  She did walk her dog on Friday, 10/08/2023.  She holds the leash with her right hand and the dog often pulls on her but she walks her dog every day and did not feel like anything happened significant on 10/08/2023.  The right shoulder pain is enough that she did not sleep well last night.  Patient is on gabapentin and hydrocodone/acetaminophen as needed for pain from a pain management group.     Past Medical History:  Diagnosis Date   Arthritis    rheumatoid arthritis    GERD (gastroesophageal reflux disease)    History of hiatal hernia    Hypertension    Stroke (HCC)    hx of mini strokes - 8 years ago - no deficits     Patient Active Problem List   Diagnosis Date Noted   OA (osteoarthritis) of hip 04/17/2015    Past Surgical History:  Procedure Laterality Date   BILATERAL CARPAL TUNNEL RELEASE     bilateral ovaries removal      CHOLECYSTECTOMY     JOINT REPLACEMENT     left knee, left hip, right knee    TONSILLECTOMY     TOTAL HIP ARTHROPLASTY Right 04/17/2015   Procedure: RIGHT TOTAL HIP ARTHROPLASTY ANTERIOR APPROACH;  Surgeon: Ollen Gross, MD;  Location: [facility];  Service: Orthopedics;  Laterality: Right;    OB History   No obstetric history on file.      Home Medications    Prior to Admission medications   Medication Sig Start Date End Date Taking? Authorizing Provider  hydrochlorothiazide (HYDRODIURIL) 25 MG tablet Take 25 mg by mouth every morning.   Yes [provider]  lisinopril (PRINIVIL,ZESTRIL) 10 MG tablet Take 10 mg by mouth every morning.   Yes [provider]  omeprazole (PRILOSEC) 20 MG capsule Take 20 mg by mouth every morning.   Yes [provider]  carvedilol (COREG) 6.25 MG tablet Take 6.25 mg by mouth every morning.    [provider]  gabapentin (NEURONTIN) 300 MG capsule Take 900 mg by mouth daily.    [provider]  HYDROcodone-acetaminophen (NORCO/VICODIN) 5-325 MG tablet Take 1 tablet by mouth 3 (three) times daily as needed.    [provider]  methocarbamol (ROBAXIN) 500 MG tablet Take 1 tablet (500 mg total) by mouth every 6 (six) hours as needed for muscle spasms. 04/18/15   Perkins, Alexzandrew L, PA-C  oxyCODONE (OXY IR/ROXICODONE) 5 MG immediate release tablet Take 1-2 tablets (5-10 mg total) by mouth every 3 (three) hours as needed for moderate pain or severe pain. 04/18/15   Perkins, Alexzandrew L, PA-C  predniSONE (DELTASONE) 5 MG tablet Take 5 mg by mouth daily.    [provider]  rivaroxaban (XARELTO) 10 MG TABS tablet Take 1 tablet (10 mg total) by mouth daily with breakfast. Take Xarelto for two and a half more weeks, then discontinue Xarelto. Once  the patient has completed the Xarelto, they may resume the 325 mg Aspirin. 04/18/15   Perkins, Alexzandrew L, PA-C  traMADol (ULTRAM) 50 MG tablet Take 1-2 tablets (50-100 mg total) by mouth every 6 (six) hours as needed (mild pain). 04/18/15   Perkins, Alexzandrew L, PA-C    Family History History reviewed. No pertinent family history.  Social History Social History   Tobacco Use   Smoking status: Never   Smokeless tobacco: Never  Substance Use Topics   Alcohol use: No   Drug use: No     Allergies   Penicillins and Vancomycin   Review of Systems Review of Systems  Constitutional:  Negative for fever.  Respiratory:  Negative for cough.   Cardiovascular:  Negative for chest pain.  Gastrointestinal:   Negative for abdominal pain, constipation, diarrhea, nausea and vomiting.  Musculoskeletal:  Positive for arthralgias (Right shoulder.  Bilateral hips and knees, with right hip and right knee worse than left hip and left knee.). Negative for back pain.  Skin:  Negative for color change and rash.  Neurological:  Negative for syncope.  All other systems reviewed and are negative.    Physical Exam Triage Vital Signs ED Triage Vitals  Encounter Vitals Group     BP 10/11/23 1042 (!) 154/88     Systolic BP Percentile --      Diastolic BP Percentile --      Pulse Rate 10/11/23 1042 82     Resp 10/11/23 1042 18     Temp 10/11/23 1042 97.9 F (36.6 C)     Temp Source 10/11/23 1042 Oral     SpO2 10/11/23 1042 96 %     Weight --      Height --      Head Circumference --      Peak Flow --      Pain Score 10/11/23 1040 8     Pain Loc --      Pain Education --      Exclude from Growth Chart --    No data found.  Updated Vital Signs BP (!) 154/88 (BP Location: Left Arm)   Pulse 82   Temp 97.9 F (36.6 C) (Oral)   Resp 18   SpO2 96%   Visual Acuity Right Eye Distance:   Left Eye Distance:   Bilateral Distance:    Right Eye Near:   Left Eye Near:    Bilateral Near:     Physical Exam Vitals and nursing note reviewed.  Constitutional:      General: She is not in acute distress.    Appearance: She is well-developed. She is not ill-appearing or toxic-appearing.  HENT:     Head: Normocephalic and atraumatic.     Right Ear: External ear normal.     Left Ear: External ear normal.     Nose: Nose normal.     Mouth/Throat:     Lips: Pink.     Mouth: Mucous membranes are moist.  Eyes:     Conjunctiva/sclera: Conjunctivae normal.     Pupils: Pupils are equal, round, and reactive to light.  Cardiovascular:     Rate and Rhythm: Normal rate and regular rhythm.     Heart sounds: S1 normal and S2 normal. No murmur heard. Pulmonary:     Effort: Pulmonary effort is normal. No  respiratory distress.     Breath sounds: Normal breath sounds. No decreased breath sounds, wheezing, rhonchi or rales.  Musculoskeletal:  General: No swelling.     Right shoulder: Deformity (Right shoulder appears visibly higher than the left shoulder.  She does have thoracic scoliosis.) and tenderness (Right shoulder) present. Decreased range of motion (Minimal change in range of motion and its associated with pain.  Mostly she is moving her right shoulder well.).     Left shoulder: Normal.     Right upper arm: Normal.     Left upper arm: Normal.     Cervical back: Tenderness (Right cervical spine and right neck.) present. No edema, erythema, rigidity or torticollis. Pain with movement present. Normal range of motion.     Thoracic back: Tenderness (Upper back) present. No swelling. Scoliosis present.     Lumbar back: Normal.  Skin:    General: Skin is warm and dry.     Capillary Refill: Capillary refill takes less than 2 seconds.     Findings: No rash.  Neurological:     Mental Status: She is alert and oriented to person, place, and time.  Psychiatric:        Mood and Affect: Mood normal.      UC Treatments / Results  Labs (all labs ordered are listed, but only abnormal results are displayed) Comprehensive Metabolic Panel: 05/27/23: Order: [id number] Component Ref Range & Units 4 mo ago  Sodium 136 - 145 mmol/L 142  Potassium 3.5 - 5.1 mmol/L 4  Comment: NO VISIBLE HEMOLYSIS  Chloride 98 - 107 mmol/L 102  CO2 21 - 31 mmol/L 31  Anion Gap 6 - 14 mmol/L 9  Glucose, Random 70 - 99 mg/dL 75  Blood Urea Nitrogen (BUN) 7 - 25 mg/dL 13  Creatinine 4.09 - 8.11 mg/dL 9.14  eGFR >78 GN/FAO/1.30Q6 58 Low   Comment: GFR estimated by CKD-EPI equations(NKF 2021).  "Recommend confirmation of Cr-based eGFR by using Cys-based eGFR and other filtration markers (if applicable) in complex cases and clinical decision-making, as needed."  Albumin 3.5 - 5.7 g/dL 4  Total  Protein 6.4 - 8.9 g/dL 6.3 Low   Bilirubin, Total 0.3 - 1.0 mg/dL 0.7  Alkaline Phosphatase (ALP) 34 - 104 U/L 67  Aspartate Aminotransferase (AST) 13 - 39 U/L 23  Alanine Aminotransferase (ALT) 7 - 52 U/L 7  Calcium 8.6 - 10.3 mg/dL 9  BUN/Creatinine Ratio   Comment: Creatinine is normal, ratio is not clinically indicated.  Resulting Agency [facility](CLIA# [id number])    EKG   Radiology DG Shoulder Right Result Date: 10/11/2023 CLINICAL DATA:  Acute right shoulder pain. EXAM: RIGHT SHOULDER - 2+ VIEW COMPARISON:  None Available. FINDINGS: There is no evidence of fracture or dislocation. Mild degenerative spurring seen involving the [facility] joint and acromion process. No other osseous abnormality identified. IMPRESSION: No acute findings. Mild degenerative spurring of AC joint and acromion process. Electronically Signed   By: Danae Orleans M.D.   On: 10/11/2023 13:32    Procedures Procedures (including critical care time)  Medications Ordered in UC Medications - No data to display  Initial Impression / Assessment and Plan / UC Course  I have reviewed the triage vital signs and the nursing notes.  Pertinent labs & imaging results that were available during my care of the patient were reviewed by me and considered in my medical decision making (see chart for details).     Plan of Care: Acute right shoulder pain and RA: X-ray shows some arthritis but are otherwise negative.  Radiology review provides the same result.  Patient did  not want additional pain medication as she has gabapentin and a narcotic pain medication from pain management.  Follow-up with pain management or arthritis specialist as needed. Scoliosis: Patient had reported in the room that she was diagnosed with scoliosis sometime in the last few months and that she had some kind of x-ray but she was not sure it was scoliosis films.  She reported that she felt like it was an incidental  diagnosis and was not sure how it impacted her.  I had reviewed the computer and then telemetry PACS records and could not find a scoliosis film record.  She did have a chest x-ray that I was not able to access from another facility and could not even see the report.  I encouraged her to get scoliosis films and she agreed.  But then when she was with the x-ray technologist she reported that she had had scoliosis films at [facility] in the last 3 months and did not want to have scoliosis films today.  Follow-up with [facility] as needed. Questions about possible heart attack: At the end of the visit, when the patient was ready to leave, she asked me why we did not do an EKG on her.  I explained that she had never told me nor the nurse about any chest pain.  She stated that she never had chest pain but her right arm and shoulder pain was so severe last night that during the night she worried it was a sign of a heart attack.  She had assumed when she told me about the arm pain that an EKG would be done but she did not ever mention an ECG.  She reports that she did tell the x-ray tech during the x-ray but the x-ray tech said she talked about so many things it was hard to know what the patient was concerned about and in the end the patient said she was concerned about her right shoulder.  I offered to do an EKG right that moment but the patient declined and said she needed to leave and take her husband to work.  Encouraged her to return here or to an emergency room if she thought she was having heart attack and she could get further workup.  Final Clinical Impressions(s) / UC Diagnoses   Final diagnoses:  Other idiopathic scoliosis, thoracic region  Acute pain of right shoulder  Rheumatoid arthritis involving multiple sites with positive rheumatoid factor [facility])     Discharge Instructions      Right shoulder x-ray appears to show some arthritis but is otherwise normal.  Will contact the patient if the  radiology review differs.  Patient has gabapentin and hydrocodone acetaminophen from pain physician and does not want or need additional pain medication at this time.  At the end of the visit as she was leaving the patient said that she had thought she would get an EKG due to her right arm pain.  I offered to do an EKG but she declined because she needed to leave and help her husband get to work.  I explained that she had never mention chest pain and that we could do an EKG at any time if she wanted to return.  She said she is not having chest pain she just thought arm pain could be a sign of a heart attack and had thought she would get an EKG.  Follow-up here or go to an emergency room if any chest pain or persisting arm pain.  An  emergency room can do not only an EKG but also the blood work necessary to rule in or out heart attack.     ED Prescriptions   None    I have reviewed the PDMP during this encounter.   Prescilla Sours, FNP 10/11/23 [phone]

## 2023-10-11 NOTE — Progress Notes (Signed)
 Right Shoulder X-Ray IMPRESSION:   No acute findings.   Mild degenerative spurring of AC joint and acromion process.  Patient updated.

## 2023-10-11 NOTE — Discharge Instructions (Addendum)
 Right shoulder x-ray appears to show some arthritis but is otherwise normal.  Will contact the patient if the radiology review differs.  Patient has gabapentin and hydrocodone acetaminophen from pain physician and does not want or need additional pain medication at this time.  At the end of the visit as she was leaving the patient said that she had thought she would get an EKG due to her right arm pain.  I offered to do an EKG but she declined because she needed to leave and help her husband get to work.  I explained that she had never mention chest pain and that we could do an EKG at any time if she wanted to return.  She said she is not having chest pain she just thought arm pain could be a sign of a heart attack and had thought she would get an EKG.  Follow-up here or go to an emergency room if any chest pain or persisting arm pain.  An emergency room can do not only an EKG but also the blood work necessary to rule in or out heart attack.
